# Patient Record
Sex: Female | Born: 1980 | Race: Black or African American | Hispanic: No | Marital: Single | Smoking: Never smoker
Health system: Southern US, Community
[De-identification: ages and names within clinical notes are randomized; demographics above are authoritative.]

## PROBLEM LIST (undated history)

## (undated) DIAGNOSIS — I10 Essential (primary) hypertension: Secondary | ICD-10-CM

## (undated) DIAGNOSIS — K219 Gastro-esophageal reflux disease without esophagitis: Secondary | ICD-10-CM

## (undated) DIAGNOSIS — Z9289 Personal history of other medical treatment: Secondary | ICD-10-CM

## (undated) DIAGNOSIS — D649 Anemia, unspecified: Secondary | ICD-10-CM

## (undated) HISTORY — DX: Essential (primary) hypertension: I10

---

## 2005-10-10 ENCOUNTER — Emergency Department (HOSPITAL_COMMUNITY): Admission: EM | Admit: 2005-10-10 | Discharge: 2005-10-11 | Payer: Self-pay | Admitting: Emergency Medicine

## 2006-08-29 ENCOUNTER — Emergency Department (HOSPITAL_COMMUNITY): Admission: EM | Admit: 2006-08-29 | Discharge: 2006-08-29 | Payer: Self-pay | Admitting: Emergency Medicine

## 2006-12-23 ENCOUNTER — Emergency Department (HOSPITAL_COMMUNITY): Admission: EM | Admit: 2006-12-23 | Discharge: 2006-12-23 | Payer: Self-pay | Admitting: Emergency Medicine

## 2007-02-09 ENCOUNTER — Other Ambulatory Visit: Admission: RE | Admit: 2007-02-09 | Discharge: 2007-02-09 | Payer: Self-pay | Admitting: Obstetrics and Gynecology

## 2007-03-18 ENCOUNTER — Emergency Department (HOSPITAL_COMMUNITY): Admission: EM | Admit: 2007-03-18 | Discharge: 2007-03-18 | Payer: Self-pay | Admitting: Emergency Medicine

## 2007-06-23 ENCOUNTER — Emergency Department (HOSPITAL_COMMUNITY): Admission: EM | Admit: 2007-06-23 | Discharge: 2007-06-23 | Payer: Self-pay | Admitting: Emergency Medicine

## 2007-07-08 ENCOUNTER — Emergency Department (HOSPITAL_COMMUNITY): Admission: EM | Admit: 2007-07-08 | Discharge: 2007-07-08 | Payer: Self-pay | Admitting: Emergency Medicine

## 2007-08-19 ENCOUNTER — Emergency Department (HOSPITAL_COMMUNITY): Admission: EM | Admit: 2007-08-19 | Discharge: 2007-08-19 | Payer: Self-pay | Admitting: Emergency Medicine

## 2007-08-26 ENCOUNTER — Emergency Department (HOSPITAL_COMMUNITY): Admission: EM | Admit: 2007-08-26 | Discharge: 2007-08-26 | Payer: Self-pay | Admitting: Emergency Medicine

## 2007-08-29 ENCOUNTER — Encounter: Admission: RE | Admit: 2007-08-29 | Discharge: 2007-08-29 | Payer: Self-pay | Admitting: Obstetrics and Gynecology

## 2007-08-30 ENCOUNTER — Encounter: Admission: RE | Admit: 2007-08-30 | Discharge: 2007-08-30 | Payer: Self-pay | Admitting: Family Medicine

## 2007-10-13 ENCOUNTER — Emergency Department (HOSPITAL_COMMUNITY): Admission: EM | Admit: 2007-10-13 | Discharge: 2007-10-13 | Payer: Self-pay | Admitting: Emergency Medicine

## 2007-11-15 ENCOUNTER — Emergency Department (HOSPITAL_COMMUNITY): Admission: EM | Admit: 2007-11-15 | Discharge: 2007-11-15 | Payer: Self-pay | Admitting: Emergency Medicine

## 2008-04-05 ENCOUNTER — Emergency Department (HOSPITAL_COMMUNITY): Admission: EM | Admit: 2008-04-05 | Discharge: 2008-04-05 | Payer: Self-pay | Admitting: Emergency Medicine

## 2008-06-14 ENCOUNTER — Emergency Department (HOSPITAL_COMMUNITY): Admission: EM | Admit: 2008-06-14 | Discharge: 2008-06-14 | Payer: Self-pay | Admitting: Family Medicine

## 2008-10-07 ENCOUNTER — Emergency Department (HOSPITAL_COMMUNITY): Admission: EM | Admit: 2008-10-07 | Discharge: 2008-10-07 | Payer: Self-pay | Admitting: Emergency Medicine

## 2009-09-30 ENCOUNTER — Ambulatory Visit: Payer: Self-pay | Admitting: Diagnostic Radiology

## 2009-09-30 ENCOUNTER — Emergency Department (HOSPITAL_BASED_OUTPATIENT_CLINIC_OR_DEPARTMENT_OTHER): Admission: EM | Admit: 2009-09-30 | Discharge: 2009-09-30 | Payer: Self-pay | Admitting: Emergency Medicine

## 2010-02-03 ENCOUNTER — Emergency Department (HOSPITAL_BASED_OUTPATIENT_CLINIC_OR_DEPARTMENT_OTHER): Admission: EM | Admit: 2010-02-03 | Discharge: 2010-02-03 | Payer: Self-pay | Admitting: Emergency Medicine

## 2010-04-07 ENCOUNTER — Emergency Department (HOSPITAL_BASED_OUTPATIENT_CLINIC_OR_DEPARTMENT_OTHER)
Admission: EM | Admit: 2010-04-07 | Discharge: 2010-04-07 | Payer: Self-pay | Source: Home / Self Care | Admitting: Emergency Medicine

## 2010-05-24 ENCOUNTER — Encounter: Payer: Self-pay | Admitting: Obstetrics and Gynecology

## 2010-06-05 ENCOUNTER — Emergency Department (HOSPITAL_BASED_OUTPATIENT_CLINIC_OR_DEPARTMENT_OTHER)
Admission: EM | Admit: 2010-06-05 | Discharge: 2010-06-05 | Disposition: A | Discharge status: Home / Self Care | Payer: Self-pay | Attending: Emergency Medicine | Admitting: Emergency Medicine

## 2010-06-05 DIAGNOSIS — B9789 Other viral agents as the cause of diseases classified elsewhere: Secondary | ICD-10-CM | POA: Insufficient documentation

## 2010-06-05 DIAGNOSIS — R509 Fever, unspecified: Secondary | ICD-10-CM | POA: Insufficient documentation

## 2010-07-14 LAB — POCT CARDIAC MARKERS
CKMB, poc: <1 ng/mL — ABNORMAL LOW (ref 1.0–8.0)
Myoglobin, poc: 45.1 ng/mL (ref 12–200)
Troponin i, poc: <0.05 ng/mL (ref 0.00–0.09)

## 2010-08-10 LAB — POCT I-STAT, CHEM 8
BUN: 9 mg/dL (ref 6–23)
Calcium, Ion: 1.05 mmol/L — ABNORMAL LOW (ref 1.12–1.32)
Chloride: 105 mEq/L (ref 96–112)
Creatinine, Ser: <0.2 mg/dL — ABNORMAL LOW (ref 0.4–1.2)

## 2010-08-10 LAB — D-DIMER, QUANTITATIVE: D-Dimer, Quant: 0.41 ug/mL-FEU (ref 0.00–0.48)

## 2010-08-14 ENCOUNTER — Emergency Department (HOSPITAL_BASED_OUTPATIENT_CLINIC_OR_DEPARTMENT_OTHER)
Admission: EM | Admit: 2010-08-14 | Discharge: 2010-08-14 | Disposition: A | Discharge status: Home / Self Care | Payer: Self-pay | Attending: Emergency Medicine | Admitting: Emergency Medicine

## 2010-08-14 DIAGNOSIS — I10 Essential (primary) hypertension: Secondary | ICD-10-CM | POA: Insufficient documentation

## 2010-08-14 DIAGNOSIS — J3489 Other specified disorders of nose and nasal sinuses: Secondary | ICD-10-CM | POA: Insufficient documentation

## 2010-08-14 DIAGNOSIS — J329 Chronic sinusitis, unspecified: Secondary | ICD-10-CM | POA: Insufficient documentation

## 2010-10-14 ENCOUNTER — Emergency Department (HOSPITAL_BASED_OUTPATIENT_CLINIC_OR_DEPARTMENT_OTHER)
Admission: EM | Admit: 2010-10-14 | Discharge: 2010-10-14 | Disposition: A | Discharge status: Home / Self Care | Payer: Self-pay | Attending: Emergency Medicine | Admitting: Emergency Medicine

## 2010-10-14 DIAGNOSIS — IMO0002 Reserved for concepts with insufficient information to code with codable children: Secondary | ICD-10-CM | POA: Insufficient documentation

## 2010-10-14 DIAGNOSIS — K219 Gastro-esophageal reflux disease without esophagitis: Secondary | ICD-10-CM | POA: Insufficient documentation

## 2011-01-26 LAB — DIFFERENTIAL
Basophils Absolute: 0
Eosinophils Absolute: 0.1
Eosinophils Relative: 3

## 2011-01-26 LAB — CBC
HCT: 35.4 — ABNORMAL LOW
MCV: 81.1
Platelets: 331
RDW: 15.5

## 2011-01-26 LAB — URINALYSIS, ROUTINE W REFLEX MICROSCOPIC
Glucose, UA: NEGATIVE
Ketones, ur: NEGATIVE
Leukocytes, UA: NEGATIVE
pH: 6.5

## 2011-01-26 LAB — URINE MICROSCOPIC-ADD ON

## 2011-01-26 LAB — GC/CHLAMYDIA PROBE AMP, GENITAL: GC Probe Amp, Genital: NEGATIVE

## 2011-01-28 LAB — CBC
HCT: 32.8 — ABNORMAL LOW
Hemoglobin: 10.8 — ABNORMAL LOW
MCHC: 33
MCV: 79.8
Platelets: 277
RBC: 4.11
RDW: 15.4
WBC: 4.4

## 2011-01-28 LAB — DIFFERENTIAL
Basophils Relative: 1
Lymphs Abs: 1.8
Monocytes Relative: 12
Neutro Abs: 1.9
Neutrophils Relative %: 43

## 2011-01-28 LAB — URINE MICROSCOPIC-ADD ON

## 2011-01-28 LAB — BASIC METABOLIC PANEL WITH GFR
BUN: 10
CO2: 25
Calcium: 8.8
Chloride: 106
Creatinine, Ser: 0.64
GFR calc non Af Amer: >60
Glucose, Bld: 92
Potassium: 3.6
Sodium: 139

## 2011-01-28 LAB — URINALYSIS, ROUTINE W REFLEX MICROSCOPIC
Glucose, UA: NEGATIVE
Hgb urine dipstick: NEGATIVE
Ketones, ur: NEGATIVE
Protein, ur: 30 — AB

## 2011-01-28 LAB — POCT PREGNANCY, URINE
Operator id: 29011
Preg Test, Ur: NEGATIVE

## 2011-01-29 LAB — URINALYSIS, ROUTINE W REFLEX MICROSCOPIC
Glucose, UA: NEGATIVE
Hgb urine dipstick: NEGATIVE
Protein, ur: NEGATIVE
pH: 6.5

## 2011-01-29 LAB — WET PREP, GENITAL
WBC, Wet Prep HPF POC: NEGATIVE — AB
Yeast Wet Prep HPF POC: NEGATIVE — AB

## 2011-01-29 LAB — POCT PREGNANCY, URINE
Operator id: 24446
Preg Test, Ur: NEGATIVE

## 2011-04-05 ENCOUNTER — Emergency Department (HOSPITAL_BASED_OUTPATIENT_CLINIC_OR_DEPARTMENT_OTHER)
Admission: EM | Admit: 2011-04-05 | Discharge: 2011-04-05 | Discharge status: Against Medical Advice | Payer: Self-pay | Attending: Emergency Medicine | Admitting: Emergency Medicine

## 2011-04-05 DIAGNOSIS — M25519 Pain in unspecified shoulder: Secondary | ICD-10-CM | POA: Insufficient documentation

## 2011-04-05 NOTE — ED Notes (Signed)
C/o pain to right shoulder since sat-has been moving since fri-pain is with movment

## 2011-11-22 ENCOUNTER — Emergency Department (HOSPITAL_BASED_OUTPATIENT_CLINIC_OR_DEPARTMENT_OTHER): Payer: Self-pay

## 2011-11-22 ENCOUNTER — Encounter (HOSPITAL_BASED_OUTPATIENT_CLINIC_OR_DEPARTMENT_OTHER): Payer: Self-pay

## 2011-11-22 ENCOUNTER — Emergency Department (HOSPITAL_BASED_OUTPATIENT_CLINIC_OR_DEPARTMENT_OTHER)
Admission: EM | Admit: 2011-11-22 | Discharge: 2011-11-22 | Disposition: A | Discharge status: Home / Self Care | Payer: Self-pay | Attending: Emergency Medicine | Admitting: Emergency Medicine

## 2011-11-22 DIAGNOSIS — M25569 Pain in unspecified knee: Secondary | ICD-10-CM | POA: Insufficient documentation

## 2011-11-22 MED ORDER — IBUPROFEN 800 MG PO TABS: 800.0000 mg | ORAL_TABLET | Freq: Three times a day (TID) | ORAL | Status: AC | PRN

## 2011-11-22 MED ORDER — HYDROCODONE-ACETAMINOPHEN 5-325 MG PO TABS
1.0000 | ORAL_TABLET | Freq: Once | ORAL | Status: AC
  Administered 2011-11-22: 1 via ORAL
  Filled 2011-11-22: qty 1

## 2011-11-22 MED ORDER — HYDROCODONE-ACETAMINOPHEN 5-325 MG PO TABS: ORAL_TABLET | ORAL | Status: AC

## 2011-11-22 NOTE — ED Provider Notes (Signed)
History     CSN: [id number]  Arrival date & time 11/22/11  1223   First MD Initiated Contact with Patient 11/22/11 1254      Chief Complaint  Patient presents with  . Knee Pain    (Consider location/radiation/quality/duration/timing/severity/associated sxs/prior treatment) HPI Comments: Patient presents with inner right knee pain that started yesterday after she tried to stand up. Patient felt a 'pop' and was immediately unable to bear weight on her knee. Patient has a history of knee injury in the 1990's that healed without injury. Patient denies numbness or tingling in her right foot. She has used ibuprofen for pain without relief. She has needed to hop to get around. No lower back pain. Nothing makes symptoms better other than rest. Onset was acute. Course is constant.  Patient is a 31 y.o. female presenting with knee pain. The history is provided by the patient.  Knee Pain This is a new problem. The current episode started yesterday. The problem occurs constantly. The problem has been unchanged. Associated symptoms include arthralgias. Pertinent negatives include no abdominal pain, fever, joint swelling, nausea, neck pain, numbness, vomiting or weakness. The symptoms are aggravated by bending and walking. She has tried NSAIDs and rest for the symptoms. The treatment provided no relief.    History reviewed. No pertinent past medical history.  Past Surgical History  Procedure Date  . Cesarean section     No family history on file.  History  Substance Use Topics  . Smoking status: Never Smoker   . Smokeless tobacco: Not on file  . Alcohol Use: No    OB History    Grav Para Term Preterm Abortions TAB SAB Ect Mult Living                  Review of Systems  Constitutional: Positive for activity change. Negative for fever.  HENT: Negative for neck pain.   Gastrointestinal: Negative for nausea, vomiting and abdominal pain.  Musculoskeletal: Positive for arthralgias and  gait problem. Negative for back pain and joint swelling.  Skin: Negative for wound.  Neurological: Negative for weakness and numbness.    Allergies  Review of patient's allergies indicates no known allergies.  Home Medications   Current Outpatient Rx  Name Route Sig Dispense Refill  . IBUPROFEN 200 MG PO TABS Oral Take 200 mg by mouth every 6 (six) hours as needed.        BP 142/86  Pulse 86  Temp 98 F (36.7 C) (Oral)  Resp 16  Ht 5\' 8"  (1.727 m)  Wt 290 lb (131.543 kg)  BMI 44.09 kg/m2  SpO2 100%  LMP 11/14/2011  Physical Exam  Nursing note and vitals reviewed. Constitutional: She is oriented to person, place, and time. She appears well-developed and well-nourished.  HENT:  Head: Normocephalic and atraumatic.  Eyes: Pupils are equal, round, and reactive to light.  Neck: Normal range of motion. Neck supple.  Cardiovascular: Exam reveals no decreased pulses.   Pulses:      Dorsalis pedis pulses are 2+ on the right side, and 2+ on the left side.       Posterior tibial pulses are 2+ on the right side, and 2+ on the left side.  Musculoskeletal: She exhibits tenderness. She exhibits no edema.       Right hip: Normal. She exhibits no tenderness.       Right knee: She exhibits decreased range of motion (due to pain with bending). She exhibits no swelling, no effusion, no  deformity, no erythema and no bony tenderness. tenderness found. Medial joint line tenderness noted. No lateral joint line and no patellar tendon tenderness noted.       Right ankle: Normal. no tenderness.       Patient able to extend R knee against gravity. Compartments soft.   Neurological: She is alert and oriented to person, place, and time. No sensory deficit.       Motor, sensation, and vascular distal to the injury is fully intact.   Skin: Skin is warm and dry.  Psychiatric: She has a normal mood and affect.    ED Course  Procedures (including critical care time)  Labs Reviewed - No data to  display Dg Knee Complete 4 Views Right  11/22/2011  *RADIOLOGY REPORT*  Clinical Data: Medial knee pain, heard a pop while walking last night  RIGHT KNEE - COMPLETE 4+ VIEW  Comparison: 04/05/2008  Findings: Joint spaces preserved. Osseous mineralization grossly normal. 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 No acute fracture, dislocation, or bone destruction. No knee joint effusion.  IMPRESSION: No acute abnormalities.  Original Report Authenticated By: Lollie Marrow, M.D.     1. Knee pain     1:18 PM Patient seen and examined. X-ray reviewed. Medications ordered.  Crutches offered and refused. Patient wants knee sleeve.   Vital signs reviewed and are as follows: Filed Vitals:   11/22/11 1241  BP: 142/86  Pulse: 86  Temp: 98 F (36.7 C)  Resp: 16   1:18 PM Patient counseled on use of narcotic pain medications. Counseled not to combine these medications with others containing tylenol. Urged not to drink alcohol, drive, or perform any other activities that requires focus while taking these medications. The patient verbalizes understanding and agrees with the plan.  Urged ortho follow-up in one week as needed. Counseled on RICE protocol.   Urged return if worsening. Patient verbalizes understanding and agrees with plan.    MDM  Patient with knee pain after injury. No vascular or neuro injury is suspected. Conservative therapy and rice protocol indicated with orthopedic followup as needed. Patient refuses crutches. Do not suspect septic joint or DVT. Patient is overweight and this likely has made symptoms worse. Do not suspect compartment syndrome or patellar tendon rupture, popliteal injury.        [place], [place] 11/22/11 1326

## 2011-11-22 NOTE — ED Notes (Signed)
Right knee pain that started last night.

## 2011-11-22 NOTE — ED Provider Notes (Signed)
Medical screening examination/treatment/procedure(s) were performed by non-physician practitioner and as supervising physician I was immediately available for consultation/collaboration.  Debanhi Blaker, MD 11/22/11 2353 

## 2012-03-01 ENCOUNTER — Encounter (HOSPITAL_BASED_OUTPATIENT_CLINIC_OR_DEPARTMENT_OTHER): Payer: Self-pay | Admitting: *Deleted

## 2012-03-01 ENCOUNTER — Emergency Department (HOSPITAL_BASED_OUTPATIENT_CLINIC_OR_DEPARTMENT_OTHER): Payer: Self-pay

## 2012-03-01 ENCOUNTER — Emergency Department (HOSPITAL_BASED_OUTPATIENT_CLINIC_OR_DEPARTMENT_OTHER)
Admission: EM | Admit: 2012-03-01 | Discharge: 2012-03-02 | Disposition: A | Discharge status: Home / Self Care | Payer: Self-pay | Attending: Emergency Medicine | Admitting: Emergency Medicine

## 2012-03-01 DIAGNOSIS — R110 Nausea: Secondary | ICD-10-CM | POA: Insufficient documentation

## 2012-03-01 DIAGNOSIS — D649 Anemia, unspecified: Secondary | ICD-10-CM | POA: Insufficient documentation

## 2012-03-01 DIAGNOSIS — M549 Dorsalgia, unspecified: Secondary | ICD-10-CM | POA: Insufficient documentation

## 2012-03-01 LAB — BASIC METABOLIC PANEL
BUN: 9 mg/dL (ref 6–23)
CO2: 25 mEq/L (ref 19–32)
Calcium: 8.6 mg/dL (ref 8.4–10.5)
Glucose, Bld: 93 mg/dL (ref 70–99)
Potassium: 3.3 mEq/L — ABNORMAL LOW (ref 3.5–5.1)
Sodium: 136 mEq/L (ref 135–145)

## 2012-03-01 LAB — URINALYSIS, ROUTINE W REFLEX MICROSCOPIC
Glucose, UA: NEGATIVE mg/dL
Ketones, ur: NEGATIVE mg/dL
Leukocytes, UA: NEGATIVE
Nitrite: NEGATIVE
Specific Gravity, Urine: 1.019 (ref 1.005–1.030)
pH: 7 (ref 5.0–8.0)

## 2012-03-01 LAB — CBC WITH DIFFERENTIAL/PLATELET
Basophils Absolute: 0.1 10*3/uL (ref 0.0–0.1)
Basophils Relative: 1 % (ref 0–1)
Eosinophils Absolute: 0.1 10*3/uL (ref 0.0–0.7)
HCT: 22.8 % — ABNORMAL LOW (ref 36.0–46.0)
Hemoglobin: 6.7 g/dL — CL (ref 12.0–15.0)
Lymphocytes Relative: 42 % (ref 12–46)
Lymphs Abs: 2.2 10*3/uL (ref 0.7–4.0)
MCH: 18.4 pg — ABNORMAL LOW (ref 26.0–34.0)
MCHC: 29.4 g/dL — ABNORMAL LOW (ref 30.0–36.0)
MCV: 62.6 fL — ABNORMAL LOW (ref 78.0–100.0)
Neutro Abs: 2.3 10*3/uL (ref 1.7–7.7)
RDW: 18.2 % — ABNORMAL HIGH (ref 11.5–15.5)

## 2012-03-01 LAB — WET PREP, GENITAL
WBC, Wet Prep HPF POC: NONE SEEN
Yeast Wet Prep HPF POC: NONE SEEN

## 2012-03-01 LAB — PREGNANCY, URINE: Preg Test, Ur: NEGATIVE

## 2012-03-01 MED ORDER — ONDANSETRON 8 MG PO TBDP
8.0000 mg | ORAL_TABLET | Freq: Once | ORAL | Status: AC
  Administered 2012-03-01: 8 mg via ORAL
  Filled 2012-03-01: qty 1

## 2012-03-01 MED ORDER — MORPHINE SULFATE 4 MG/ML IJ SOLN
4.0000 mg | Freq: Once | INTRAMUSCULAR | Status: AC
  Administered 2012-03-01: 4 mg via INTRAVENOUS
  Filled 2012-03-01: qty 1

## 2012-03-01 MED ORDER — IOHEXOL 300 MG/ML  SOLN
100.0000 mL | Freq: Once | INTRAMUSCULAR | Status: AC | PRN
  Administered 2012-03-02: 100 mL via INTRAVENOUS

## 2012-03-01 MED ORDER — OXYCODONE-ACETAMINOPHEN 5-325 MG PO TABS
2.0000 | ORAL_TABLET | Freq: Once | ORAL | Status: AC
  Administered 2012-03-01: 2 via ORAL
  Filled 2012-03-01 (×2): qty 2

## 2012-03-01 MED ORDER — IOHEXOL 300 MG/ML  SOLN
50.0000 mL | Freq: Once | INTRAMUSCULAR | Status: AC | PRN
  Administered 2012-03-02: 50 mL via ORAL

## 2012-03-01 MED ORDER — SODIUM CHLORIDE 0.9 % IV SOLN
Freq: Once | INTRAVENOUS | Status: AC
  Administered 2012-03-01: 23:00:00 via INTRAVENOUS

## 2012-03-01 MED ORDER — ONDANSETRON HCL 4 MG/2ML IJ SOLN
4.0000 mg | Freq: Once | INTRAMUSCULAR | Status: AC
  Administered 2012-03-01: 4 mg via INTRAVENOUS
  Filled 2012-03-01: qty 2

## 2012-03-01 NOTE — ED Notes (Signed)
Pt c/o right lower abd pain x 1 hr 

## 2012-03-01 NOTE — ED Notes (Signed)
HGB of 6.7 reported to Dr Bebe Shaggy.

## 2012-03-01 NOTE — ED Provider Notes (Signed)
History     CSN: [id number]  Arrival date & time 03/01/12  1947   First MD Initiated Contact with Patient 03/01/12 2127      Chief Complaint  Patient presents with  . Abdominal Pain     Patient is a 31 y.o. female presenting with abdominal pain. The history is provided by the patient.  Abdominal Pain The primary symptoms of the illness include abdominal pain and nausea. The primary symptoms of the illness do not include fever, vomiting, diarrhea, dysuria, vaginal discharge or vaginal bleeding. Episode onset: just prior to arrival. The onset of the illness was sudden. The problem has been gradually worsening.  Additional symptoms associated with the illness include back pain. Symptoms associated with the illness do not include urgency or frequency.  pt reports onset of RLQ pain just prior to arrival She reports some associated back pain She was working at the time, no exertion noted No vag bleeding/discharge No fever.  No dysuria She has never had this before   History reviewed. No pertinent past medical history.  Past Surgical History  Procedure Date  . Cesarean section     History reviewed. No pertinent family history.  History  Substance Use Topics  . Smoking status: Never Smoker   . Smokeless tobacco: Not on file  . Alcohol Use: No    OB History    Grav Para Term Preterm Abortions TAB SAB Ect Mult Living                  Review of Systems  Constitutional: Negative for fever.  Gastrointestinal: Positive for nausea and abdominal pain. Negative for vomiting and diarrhea.  Genitourinary: Negative for dysuria, urgency, frequency, vaginal bleeding and vaginal discharge.  Musculoskeletal: Positive for back pain.  Neurological: Negative for weakness.  All other systems reviewed and are negative.    Allergies  Review of patient's allergies indicates no known allergies.  Home Medications   Current Outpatient Rx  Name Route Sig Dispense Refill  . IBUPROFEN  200 MG PO TABS Oral Take 200 mg by mouth every 6 (six) hours as needed.        Pulse 91  Temp 98.5 F (36.9 C) (Oral)  Resp 16  Ht 5\' 9"  (1.753 m)  Wt 290 lb (131.543 kg)  BMI 42.83 kg/m2  SpO2 99%  LMP 02/29/2012  Physical Exam CONSTITUTIONAL: Well developed/well nourished HEAD AND FACE: Normocephalic/atraumatic EYES: EOMI/PERRL ENMT: Mucous membranes moist NECK: supple no meningeal signs SPINE:entire spine nontender CV: S1/S2 noted, no murmurs/rubs/gallops noted LUNGS: Lungs are clear to auscultation bilaterally, no apparent distress ABDOMEN: soft, RLQ tenderness noted, tenderness is moderate, no rebound or guarding GU:no cva tenderness, no cmt, small amt of vag discharge, no vag bleeding, no adnexal tenderness/mass, chaperone present NEURO: Pt is awake/alert, moves all extremitiesx4 EXTREMITIES: pulses normal, full ROM SKIN: warm, color normal PSYCH: no abnormalities of mood noted  ED Course  Procedures  Labs Reviewed  WET PREP, GENITAL - Abnormal; Notable for the following:    Clue Cells Wet Prep HPF POC FEW (*)     All other components within normal limits  URINALYSIS, ROUTINE W REFLEX MICROSCOPIC  PREGNANCY, URINE  GC/CHLAMYDIA PROBE AMP, GENITAL  CBC WITH DIFFERENTIAL  BASIC METABOLIC PANEL  11:08 PM Pt with continued RLQ pain.  Her pelvic exam was unremarkable.  Given persistent pain, will proceed with CT imaging Pt agreeable At signout, plan is to f/u on labs/imaging and reassess   MDM  Nursing notes including past  medical history and social history reviewed and considered in documentation Labs/vital reviewed and considered         Joya Gaskins, MD 03/01/12 2308

## 2012-03-02 LAB — GC/CHLAMYDIA PROBE AMP, GENITAL
Chlamydia, DNA Probe: NEGATIVE
GC Probe Amp, Genital: NEGATIVE

## 2012-03-02 MED ORDER — FERROUS SULFATE 325 (65 FE) MG PO TABS: 325.0000 mg | ORAL_TABLET | Freq: Three times a day (TID) | ORAL | Status: DC

## 2012-03-02 NOTE — ED Notes (Signed)
Patient transported to CT 

## 2012-04-13 ENCOUNTER — Encounter (HOSPITAL_BASED_OUTPATIENT_CLINIC_OR_DEPARTMENT_OTHER): Payer: Self-pay | Admitting: Emergency Medicine

## 2012-04-13 ENCOUNTER — Emergency Department (HOSPITAL_BASED_OUTPATIENT_CLINIC_OR_DEPARTMENT_OTHER)
Admission: EM | Admit: 2012-04-13 | Discharge: 2012-04-13 | Disposition: A | Discharge status: Home / Self Care | Payer: Self-pay | Attending: Emergency Medicine | Admitting: Emergency Medicine

## 2012-04-13 DIAGNOSIS — R079 Chest pain, unspecified: Secondary | ICD-10-CM | POA: Insufficient documentation

## 2012-04-13 DIAGNOSIS — K2960 Other gastritis without bleeding: Secondary | ICD-10-CM | POA: Insufficient documentation

## 2012-04-13 DIAGNOSIS — G8929 Other chronic pain: Secondary | ICD-10-CM | POA: Insufficient documentation

## 2012-04-13 DIAGNOSIS — K089 Disorder of teeth and supporting structures, unspecified: Secondary | ICD-10-CM | POA: Insufficient documentation

## 2012-04-13 MED ORDER — HYDROCODONE-ACETAMINOPHEN 5-325 MG PO TABS: 1.0000 | ORAL_TABLET | Freq: Four times a day (QID) | ORAL | Status: DC | PRN

## 2012-04-13 MED ORDER — FAMOTIDINE 20 MG PO TABS
ORAL_TABLET | ORAL | Status: AC
  Filled 2012-04-13: qty 1

## 2012-04-13 MED ORDER — OMEPRAZOLE 20 MG PO CPDR: 20.0000 mg | DELAYED_RELEASE_CAPSULE | Freq: Every day | ORAL | Status: DC

## 2012-04-13 MED ORDER — PANTOPRAZOLE SODIUM 40 MG PO TBEC: 40.0000 mg | DELAYED_RELEASE_TABLET | Freq: Every day | ORAL | Status: DC

## 2012-04-13 MED ORDER — ALUM & MAG HYDROXIDE-SIMETH 200-200-20 MG/5ML PO SUSP
30.0000 mL | Freq: Once | ORAL | Status: AC
  Administered 2012-04-13: 30 mL via ORAL
  Filled 2012-04-13: qty 30

## 2012-04-13 MED ORDER — FAMOTIDINE 20 MG PO TABS
20.0000 mg | ORAL_TABLET | Freq: Once | ORAL | Status: AC
  Administered 2012-04-13: 20 mg via ORAL

## 2012-04-13 NOTE — ED Provider Notes (Signed)
History     CSN: [id number]  Arrival date & time 04/13/12  2127   First MD Initiated Contact with Patient 04/13/12 2245      Chief Complaint  Patient presents with  . Dental Pain  . Chest Pain    (Consider location/radiation/quality/duration/timing/severity/associated sxs/prior treatment) HPITameka Perez is a 31 y.o. female presenting with chest pain that is mid to low sternal even subxiphoid it started while resting, she was at the computer, she says the chest pain can be worse with movement and deep breathing. The pain is not associated with nausea vomiting, diaphoresis, or radiation.  Pain is described as sharp, and moderate to severe.  Patient is also complaining about pain which is sharp, lancinating at times into the right side of her face, this comes from a carious tooth which she does have a followup appointment to see a dentist next week. Pain is been severe, she has been taking ibuprofen frequently for her pain. Patient denies any history of blood clots in her legs or lungs, any recent injuries, any recent long periods of travel, no history of hemoptysis, she does not take exogenous estrogen.  Patient does not smoke, is not diabetic, does not have high blood pressure, is not being treated for hyperlipidemia. History reviewed. No pertinent past medical history.  Past Surgical History  Procedure Date  . Cesarean section     No family history on file.  History  Substance Use Topics  . Smoking status: Never Smoker   . Smokeless tobacco: Not on file  . Alcohol Use: No    OB History    Grav Para Term Preterm Abortions TAB SAB Ect Mult Living                  Review of Systems At least 10pt or greater review of systems completed and are negative except where specified in the HPI.  Allergies  Review of patient's allergies indicates no known allergies.  Home Medications   Current Outpatient Rx  Name  Route  Sig  Dispense  Refill  . FERROUS SULFATE 325 (65 FE)  MG PO TABS   Oral   Take 1 tablet (325 mg total) by mouth 3 (three) times daily with meals.   90 tablet   0   . IBUPROFEN 200 MG PO TABS   Oral   Take 400 mg by mouth once.          Marland Kitchen HYDROCODONE-ACETAMINOPHEN 5-325 MG PO TABS   Oral   Take 1-2 tablets by mouth every 6 (six) hours as needed for pain.   29 tablet   0   . OMEPRAZOLE 20 MG PO CPDR   Oral   Take 1 capsule (20 mg total) by mouth daily. NSAID induced gastritis   30 capsule   0     BP 176/94  Pulse 87  Temp 98.3 F (36.8 C) (Oral)  Resp 18  Ht 5\' 8"  (1.727 m)  Wt 295 lb (133.811 kg)  BMI 44.85 kg/m2  SpO2 100%  Physical Exam  Nursing notes reviewed.  Electronic medical record reviewed. VITAL SIGNS:   Filed Vitals:   04/13/12 2132  BP: 176/94  Pulse: 87  Temp: 98.3 F (36.8 C)  TempSrc: Oral  Resp: 18  Height: 5\' 8"  (1.727 m)  Weight: 295 lb (133.811 kg)  SpO2: 100%   CONSTITUTIONAL: Awake, oriented, appears non-toxic HENT: Atraumatic, normocephalic, oral mucosa pink and moist, airway patent. Nares patent without drainage. External ears normal. EYES: Conjunctiva  clear, EOMI, PERRLA NECK: Trachea midline, non-tender, supple CARDIOVASCULAR: Normal heart rate, Normal rhythm, No murmurs, rubs, gallops PULMONARY/CHEST: Clear to auscultation, no rhonchi, wheezes, or rales. Symmetrical breath sounds. Reproducible chest pain in the lower sternum. ABDOMINAL: Non-distended, soft, non-tender - no rebound or guarding.  BS normal. NEUROLOGIC: Non-focal, moving all four extremities, no gross sensory or motor deficits. EXTREMITIES: No clubbing, cyanosis, or edema SKIN: Warm, Dry, No erythema, No rash  ED Course  Procedures (including critical care time)  Date: 04/14/2012  Rate: 77  Rhythm: normal sinus rhythm  QRS Axis: normal  Intervals: normal  ST/T Wave abnormalities: normal  Conduction Disutrbances: none  Narrative Interpretation: unremarkable - nonischemic EKG with, no significant changes when  compared with prior EKG dated 04/07/2000   Labs Reviewed - No data to display No results found.   1. Chronic dental pain   2. NSAID induced gastritis       MDM  Gabriela Perez is a 31 y.o. female presenting with a sharp intermittent chest pain, patient has been taking frequent ibuprofen for her dental pain, she says that it's not really helping - EKG is completely normal. Patient is mildly hypertensive emergency department but has not been treated for hypertension the past. Patient has no family history of early coronary disease or MI or sudden cardiac death either, but she is low risk for CAD or ACS at this time. She is PERC negative. Breath sounds are clear bilaterally, do not think she's got any emergent intrathoracic condition at this time. Do not think further testing is indicated at this time. Will treat the patient with Prilosec for presumed NSAID induced gastritis. Give the patient some pain medicine for her tooth-she does have followup with a dentist next week.  I explained the diagnosis and have given explicit precautions to return to the ER including any other new or worsening symptoms. The patient understands and accepts the medical plan as it's been dictated and I have answered their questions. Discharge instructions concerning home care and prescriptions have been given.  The patient is STABLE and is discharged to home in good condition.         Jones Skene, MD 04/14/12 0005

## 2012-04-13 NOTE — ED Notes (Signed)
Pt c/o tooth pain and chest pain. Pt states chest pain is sharp, midsternal, started while sitting at computer. Pt states chest pain is worse with movement and deep breath. Pt denies any shob, n/v/d.

## 2012-08-01 DIAGNOSIS — Z9289 Personal history of other medical treatment: Secondary | ICD-10-CM

## 2012-08-01 HISTORY — DX: Personal history of other medical treatment: Z92.89

## 2012-08-09 ENCOUNTER — Inpatient Hospital Stay (HOSPITAL_COMMUNITY): Payer: Medicaid Other

## 2012-08-09 ENCOUNTER — Inpatient Hospital Stay (HOSPITAL_COMMUNITY)
Admission: AD | Admit: 2012-08-09 | Discharge: 2012-08-10 | Disposition: A | Discharge status: Home / Self Care | Payer: Medicaid Other | Source: Ambulatory Visit | Attending: Obstetrics & Gynecology | Admitting: Obstetrics & Gynecology

## 2012-08-09 ENCOUNTER — Encounter (HOSPITAL_COMMUNITY): Payer: Self-pay | Admitting: *Deleted

## 2012-08-09 DIAGNOSIS — N83299 Other ovarian cyst, unspecified side: Secondary | ICD-10-CM

## 2012-08-09 DIAGNOSIS — D500 Iron deficiency anemia secondary to blood loss (chronic): Secondary | ICD-10-CM

## 2012-08-09 DIAGNOSIS — N83209 Unspecified ovarian cyst, unspecified side: Secondary | ICD-10-CM | POA: Insufficient documentation

## 2012-08-09 DIAGNOSIS — R109 Unspecified abdominal pain: Secondary | ICD-10-CM | POA: Insufficient documentation

## 2012-08-09 DIAGNOSIS — N920 Excessive and frequent menstruation with regular cycle: Secondary | ICD-10-CM | POA: Insufficient documentation

## 2012-08-09 DIAGNOSIS — L293 Anogenital pruritus, unspecified: Secondary | ICD-10-CM | POA: Insufficient documentation

## 2012-08-09 DIAGNOSIS — I10 Essential (primary) hypertension: Secondary | ICD-10-CM | POA: Insufficient documentation

## 2012-08-09 DIAGNOSIS — N940 Mittelschmerz: Secondary | ICD-10-CM | POA: Insufficient documentation

## 2012-08-09 HISTORY — DX: Anemia, unspecified: D64.9

## 2012-08-09 LAB — CBC
HCT: 23.6 % — ABNORMAL LOW (ref 36.0–46.0)
Hemoglobin: 6.6 g/dL — CL (ref 12.0–15.0)
MCV: 61.5 fL — ABNORMAL LOW (ref 78.0–100.0)
RBC: 3.84 MIL/uL — ABNORMAL LOW (ref 3.87–5.11)
RDW: 18.8 % — ABNORMAL HIGH (ref 11.5–15.5)
WBC: 5.5 10*3/uL (ref 4.0–10.5)

## 2012-08-09 LAB — POCT PREGNANCY, URINE: Preg Test, Ur: NEGATIVE

## 2012-08-09 LAB — URINALYSIS, ROUTINE W REFLEX MICROSCOPIC
Glucose, UA: NEGATIVE mg/dL
Leukocytes, UA: NEGATIVE
Protein, ur: NEGATIVE mg/dL
pH: 6 (ref 5.0–8.0)

## 2012-08-09 LAB — WET PREP, GENITAL: Clue Cells Wet Prep HPF POC: NONE SEEN

## 2012-08-09 MED ORDER — OXYCODONE-ACETAMINOPHEN 5-325 MG PO TABS
ORAL_TABLET | ORAL | Status: AC
  Filled 2012-08-09: qty 2

## 2012-08-09 MED ORDER — OXYCODONE-ACETAMINOPHEN 5-325 MG PO TABS: 2.0000 | ORAL_TABLET | Freq: Once | ORAL | Status: DC

## 2012-08-09 MED ORDER — KETOROLAC TROMETHAMINE 60 MG/2ML IM SOLN
60.0000 mg | Freq: Once | INTRAMUSCULAR | Status: AC
  Administered 2012-08-09: 60 mg via INTRAMUSCULAR
  Filled 2012-08-09: qty 2

## 2012-08-09 NOTE — MAU Note (Addendum)
Pt has Critical value Hgb 6.6 Gabriela Perez CNm informedCRITICAL VALUE ALERT  Critical value received:  2304  Date of notification:  08/09/12  Time of notification: 2304  Critical value read back:yes  Nurse who received alert:  B.Eduard Clos RN  MD notified (1st page):  Ivonne Andrew CNM  Time of first page:  2304  MD notified (2nd page):  Time of second page:  Responding MD:  GabrielaSmtih CNM  Time MD responded:

## 2012-08-09 NOTE — MAU Provider Note (Signed)
Chief Complaint: Abdominal Pain  First Provider Initiated Contact with Patient 08/09/12 2155      SUBJECTIVE HPI: Gabriela Perez is a 32 y.o. G1P1001 non-pregnant female who presents with sharp low mid abd pain x 6 hours and vaginal itching. No relief of pain w/ Ibuprofen. No Hx similar pain. There are no aggravating or aleviating factors. Patient's last menstrual period was 07/30/2012. Rates pain 8/10.  Past Medical History  Diagnosis Date  . Anemia    OB History   Grav Para Term Preterm Abortions TAB SAB Ect Mult Living   1 1 1  0 0 0 0 0 0 1     # Outc Date GA Lbr Len/2nd Wgt Sex Del Anes PTL Lv   1 TRM              Past Surgical History  Procedure Laterality Date  . Cesarean section     History   Social History  . Marital Status: Single    Spouse Name: N/A    Number of Children: N/A  . Years of Education: N/A   Occupational History  . Not on file.   Social History Main Topics  . Smoking status: Never Smoker   . Smokeless tobacco: Not on file  . Alcohol Use: No  . Drug Use: No  . Sexually Active: Not on file   Other Topics Concern  . Not on file   Social History Narrative  . No narrative on file   No current facility-administered medications on file prior to encounter.   No current outpatient prescriptions on file prior to encounter.   No Known Allergies  ROS: Neg for fever, chills, urinary complaints, GI complaints, vaginal discharge, vaginal bleeding, dyspareunia.   OBJECTIVE Blood pressure 159/86, pulse 83, temperature 98 F (36.7 C), temperature source Oral, resp. rate 20, height 5\' 9"  (1.753 m), weight 137.44 kg (303 lb), last menstrual period 07/30/2012, SpO2 100.00%. GENERAL: Well-developed, well-nourished female in mild distress.  HEENT: Normocephalic HEART: normal rate RESP: normal effort ABDOMEN: Soft, non-tender. Pos BS x 4 EXTREMITIES: Nontender, no edema NEURO: Alert and oriented SPECULUM EXAM: NEFG, thick, clear, mildly malodorous  discharge, no blood noted, cervix clean BIMANUAL: cervix closed; UTA uterine size due to body habitus, moderate supropubic tenderness. No CMT. No adnexal tenderness or masses  LAB RESULTS Results for orders placed during the hospital encounter of 08/09/12 (from the past 24 hour(s))  URINALYSIS, ROUTINE W REFLEX MICROSCOPIC     Status: None   Collection Time    08/09/12  6:45 PM      Result Value Range   Color, Urine YELLOW  YELLOW   APPearance CLEAR  CLEAR   Specific Gravity, Urine 1.025  1.005 - 1.030   pH 6.0  5.0 - 8.0   Glucose, UA NEGATIVE  NEGATIVE mg/dL   Hgb urine dipstick NEGATIVE  NEGATIVE   Bilirubin Urine NEGATIVE  NEGATIVE   Ketones, ur NEGATIVE  NEGATIVE mg/dL   Protein, ur NEGATIVE  NEGATIVE mg/dL   Urobilinogen, UA 0.2  0.0 - 1.0 mg/dL   Nitrite NEGATIVE  NEGATIVE   Leukocytes, UA NEGATIVE  NEGATIVE  POCT PREGNANCY, URINE     Status: None   Collection Time    08/09/12  6:52 PM      Result Value Range   Preg Test, Ur NEGATIVE  NEGATIVE  WET PREP, GENITAL     Status: None   Collection Time    08/09/12  8:50 PM      Result  Value Range   Yeast Wet Prep HPF POC NONE SEEN  NONE SEEN   Trich, Wet Prep NONE SEEN  NONE SEEN   Clue Cells Wet Prep HPF POC NONE SEEN  NONE SEEN   WBC, Wet Prep HPF POC NONE SEEN  NONE SEEN  CBC     Status: Abnormal   Collection Time    08/09/12 10:20 PM      Result Value Range   WBC 5.5  4.0 - 10.5 K/uL   RBC 3.84 (*) 3.87 - 5.11 MIL/uL   Hemoglobin 6.6 (*) 12.0 - 15.0 g/dL   HCT 29.5 (*) 62.1 - 30.8 %   MCV 61.5 (*) 78.0 - 100.0 fL   MCH 17.2 (*) 26.0 - 34.0 pg   MCHC 28.0 (*) 30.0 - 36.0 g/dL   RDW 65.7 (*) 84.6 - 96.2 %   Platelets 505 (*) 150 - 400 K/uL    IMAGING US Transvaginal Non-ob  08/10/2012  *RADIOLOGY REPORT*  Clinical Data: Pelvic pain.  TRANSABDOMINAL AND TRANSVAGINAL ULTRASOUND OF PELVIS Technique:  Both transabdominal and transvaginal ultrasound examinations of the pelvis were performed. Transabdominal technique  was performed for global imaging of the pelvis including uterus, ovaries, adnexal regions, and pelvic cul-de-sac.  It was necessary to proceed with endovaginal exam following the transabdominal exam to visualize the ovaries.  Comparison:  CT abdomen and pelvis 03/02/2012.  Pelvic ultrasound 08/26/2007.  Findings:  Uterus: The uterus is enlarged, measuring 16.1 x 5.0 x 6.6 cm.  At least two large fibroids are evident.  A fibroid in the lower uterine segment is 2.8 x 2.8 x 3.0 cm.  A more fundal fibroid measures 2.5 x 2.1 x 2.3 cm.  Endometrium: Normal in size.  The maximal thickness is 9.4 mm.  Right ovary:  The right ovary is of normal size and echotexture measuring 2.8 x 1.9 x 1.5 cm.  Left ovary: The left ovary measures 3.3 x 2.2 x 2.6 cm.  A para ovarian cyst measures 2.4 x 1.8 x 1.3 cm.  Other findings: No free fluid  IMPRESSION:  1.  Increasing size of uterine fibroids. 2.  2.4 cm left para ovarian cyst.   Original Report Authenticated By: Marin Roberts, M.D.    US Pelvis Complete  08/10/2012  *RADIOLOGY REPORT*  Clinical Data: Pelvic pain.  TRANSABDOMINAL AND TRANSVAGINAL ULTRASOUND OF PELVIS Technique:  Both transabdominal and transvaginal ultrasound examinations of the pelvis were performed. Transabdominal technique was performed for global imaging of the pelvis including uterus, ovaries, adnexal regions, and pelvic cul-de-sac.  It was necessary to proceed with endovaginal exam following the transabdominal exam to visualize the ovaries.  Comparison:  CT abdomen and pelvis 03/02/2012.  Pelvic ultrasound 08/26/2007.  Findings:  Uterus: The uterus is enlarged, measuring 16.1 x 5.0 x 6.6 cm.  At least two large fibroids are evident.  A fibroid in the lower uterine segment is 2.8 x 2.8 x 3.0 cm.  A more fundal fibroid measures 2.5 x 2.1 x 2.3 cm.  Endometrium: Normal in size.  The maximal thickness is 9.4 mm.  Right ovary:  The right ovary is of normal size and echotexture measuring 2.8 x 1.9 x 1.5 cm.   Left ovary: The left ovary measures 3.3 x 2.2 x 2.6 cm.  A para ovarian cyst measures 2.4 x 1.8 x 1.3 cm.  Other findings: No free fluid  IMPRESSION:  1.  Increasing size of uterine fibroids. 2.  2.4 cm left para ovarian cyst.   Original Report  Authenticated By: Marin Roberts, M.D.     MAU COURSE Mild relief of pain w/ 2 percocet. Toradol ordered. Waiting for [place].   Pain resolved.   ASSESSMENT 1. Physiological ovarian cysts   2. Mittelschmerz   3. 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   4. Hypertension     PLAN Discharge home. Increase dietary iron. Take FeSo4 TID as directed and stool softener PRN.     Follow-up Information   Follow up with [facility]. (Will call to schedule appointment)    Contact information:   [address] [place] [address] [phone]      Follow up with [facility]. (for primary care, hypertension)    Contact information:   [address] [place] [address] [phone]      Follow up with [facility] [place] [facility]. (As needed if symptoms worsen)    Contact information:   [address] [place] [address] [phone]       Medication List    STOP taking these medications       ibuprofen 200 MG tablet  Commonly known as:  ADVIL,MOTRIN      TAKE these medications       ferrous sulfate 325 (65 FE) MG tablet  Take 325 mg by mouth 3 (three) times daily with meals.     ketorolac 10 MG tablet  Commonly known as:  TORADOL  Take 1 tablet (10 mg total) by mouth every 6 (six) hours as needed for pain.     oxyCODONE-acetaminophen 5-325 MG per tablet  Commonly known as:  PERCOCET/ROXICET  Take 1-2 tablets by mouth every 4 (four) hours as needed for pain.       [place], CNM 08/10/2012  12:59 AM

## 2012-08-09 NOTE — MAU Note (Signed)
Pt states she has been having abdominal pain for about 6 hours ago. Pt denies vaginal bleeding and discharge

## 2012-08-09 NOTE — MAU Note (Signed)
Patient states she has had vaginal itching for about one week. Has had lower abdominal pain for about 4 hours. Denies bleeding

## 2012-08-10 DIAGNOSIS — N83209 Unspecified ovarian cyst, unspecified side: Secondary | ICD-10-CM

## 2012-08-10 MED ORDER — NYSTATIN-TRIAMCINOLONE 100000-0.1 UNIT/GM-% EX OINT: TOPICAL_OINTMENT | Freq: Two times a day (BID) | CUTANEOUS | Status: DC

## 2012-08-10 MED ORDER — KETOROLAC TROMETHAMINE 10 MG PO TABS: 10.0000 mg | ORAL_TABLET | Freq: Four times a day (QID) | ORAL | Status: DC | PRN

## 2012-08-10 MED ORDER — OXYCODONE-ACETAMINOPHEN 5-325 MG PO TABS: 1.0000 | ORAL_TABLET | ORAL | Status: DC | PRN

## 2012-08-11 ENCOUNTER — Encounter: Payer: Self-pay | Admitting: *Deleted

## 2012-08-14 ENCOUNTER — Encounter (HOSPITAL_BASED_OUTPATIENT_CLINIC_OR_DEPARTMENT_OTHER): Payer: Self-pay | Admitting: *Deleted

## 2012-08-14 ENCOUNTER — Observation Stay (HOSPITAL_BASED_OUTPATIENT_CLINIC_OR_DEPARTMENT_OTHER)
Admission: EM | Admit: 2012-08-14 | Discharge: 2012-08-15 | Disposition: A | Discharge status: Home / Self Care | Payer: Medicaid Other | Attending: Obstetrics & Gynecology | Admitting: Obstetrics & Gynecology

## 2012-08-14 DIAGNOSIS — D219 Benign neoplasm of connective and other soft tissue, unspecified: Secondary | ICD-10-CM

## 2012-08-14 DIAGNOSIS — N920 Excessive and frequent menstruation with regular cycle: Secondary | ICD-10-CM

## 2012-08-14 DIAGNOSIS — R030 Elevated blood-pressure reading, without diagnosis of hypertension: Secondary | ICD-10-CM | POA: Insufficient documentation

## 2012-08-14 DIAGNOSIS — N83209 Unspecified ovarian cyst, unspecified side: Secondary | ICD-10-CM | POA: Insufficient documentation

## 2012-08-14 DIAGNOSIS — D500 Iron deficiency anemia secondary to blood loss (chronic): Secondary | ICD-10-CM

## 2012-08-14 DIAGNOSIS — D259 Leiomyoma of uterus, unspecified: Secondary | ICD-10-CM | POA: Insufficient documentation

## 2012-08-14 DIAGNOSIS — D649 Anemia, unspecified: Secondary | ICD-10-CM

## 2012-08-14 LAB — CBC WITH DIFFERENTIAL/PLATELET
Basophils Relative: 1 % (ref 0–1)
Eosinophils Absolute: 0.1 10*3/uL (ref 0.0–0.7)
Eosinophils Relative: 2 % (ref 0–5)
HCT: 25.6 % — ABNORMAL LOW (ref 36.0–46.0)
Hemoglobin: 7.4 g/dL — ABNORMAL LOW (ref 12.0–15.0)
Lymphs Abs: 1.8 10*3/uL (ref 0.7–4.0)
MCH: 17.5 pg — ABNORMAL LOW (ref 26.0–34.0)
MCHC: 28.9 g/dL — ABNORMAL LOW (ref 30.0–36.0)
MCV: 60.7 fL — ABNORMAL LOW (ref 78.0–100.0)
Monocytes Absolute: 0.6 10*3/uL (ref 0.1–1.0)
Neutro Abs: 3 10*3/uL (ref 1.7–7.7)
Neutrophils Relative %: 55 % (ref 43–77)

## 2012-08-14 LAB — URINALYSIS, ROUTINE W REFLEX MICROSCOPIC
Bilirubin Urine: NEGATIVE
Hgb urine dipstick: NEGATIVE
Ketones, ur: NEGATIVE mg/dL
Nitrite: NEGATIVE
Urobilinogen, UA: 1 mg/dL (ref 0.0–1.0)

## 2012-08-14 LAB — BASIC METABOLIC PANEL
BUN: 6 mg/dL (ref 6–23)
Calcium: 8.9 mg/dL (ref 8.4–10.5)
Creatinine, Ser: 0.5 mg/dL (ref 0.50–1.10)
GFR calc Af Amer: >90 mL/min (ref >90)
GFR calc non Af Amer: >90 mL/min (ref >90)
Glucose, Bld: 90 mg/dL (ref 70–99)

## 2012-08-14 MED ORDER — KETOROLAC TROMETHAMINE 30 MG/ML IJ SOLN
30.0000 mg | Freq: Once | INTRAMUSCULAR | Status: AC
  Administered 2012-08-14: 30 mg via INTRAVENOUS
  Filled 2012-08-14: qty 1

## 2012-08-14 MED ORDER — SODIUM CHLORIDE 0.9 % IV BOLUS (SEPSIS)
1000.0000 mL | Freq: Once | INTRAVENOUS | Status: AC
  Administered 2012-08-14: 1000 mL via INTRAVENOUS

## 2012-08-14 MED ORDER — DIPHENHYDRAMINE HCL 50 MG/ML IJ SOLN
25.0000 mg | Freq: Once | INTRAMUSCULAR | Status: AC
  Administered 2012-08-14: 25 mg via INTRAVENOUS
  Filled 2012-08-14: qty 1

## 2012-08-14 NOTE — ED Notes (Signed)
Family at bedside. 

## 2012-08-14 NOTE — ED Notes (Signed)
No complaints of dizziness or nausea

## 2012-08-14 NOTE — ED Provider Notes (Signed)
History     CSN: [id number]  Arrival date & time 08/14/12  1904   First MD Initiated Contact with Patient 08/14/12 1915      Chief Complaint  Patient presents with  . Hypertension    (Consider location/radiation/quality/duration/timing/severity/associated sxs/prior treatment) HPI Comments: Patient is a 32 year old female with a past medical history of anemia who presents after a syncopal episode that occurred earlier today. Patient reports having abdominal pain, feeling dizzy and falling to the floor. Her friend saw her after she fell and states she was "unresponsive." Patient currently reports RLQ pain that is sharp and moderate. She reports associated headache, weakness, and chills. No aggravating/alleviating factors. LMP 07/30/2012.   Patient is a 32 y.o. female presenting with hypertension.  Hypertension Associated symptoms include abdominal pain.    Past Medical History  Diagnosis Date  . Anemia     Past Surgical History  Procedure Laterality Date  . Cesarean section      Family History  Problem Relation Age of Onset  . Hypertension Mother   . Diabetes Mother   . Hypertension Father     History  Substance Use Topics  . Smoking status: Never Smoker   . Smokeless tobacco: Not on file  . Alcohol Use: No    OB History   Grav Para Term Preterm Abortions TAB SAB Ect Mult Living   1 1 1  0 0 0 0 0 0 1      Review of Systems  Gastrointestinal: Positive for abdominal pain.  Neurological: Positive for dizziness and syncope.  All other systems reviewed and are negative.    Allergies  Review of patient's allergies indicates no known allergies.  Home Medications   Current Outpatient Rx  Name  Route  Sig  Dispense  Refill  . ferrous sulfate 325 (65 FE) MG tablet   Oral   Take 325 mg by mouth 3 (three) times daily with meals.         Marland Kitchen ketorolac (TORADOL) 10 MG tablet   Oral   Take 1 tablet (10 mg total) by mouth every 6 (six) hours as needed for pain.  20 tablet   0   . nystatin-triamcinolone ointment (MYCOLOG)   Topical   Apply topically 2 (two) times daily.   30 g   1   . oxyCODONE-acetaminophen (PERCOCET/ROXICET) 5-325 MG per tablet   Oral   Take 1-2 tablets by mouth every 4 (four) hours as needed for pain.   40 tablet   0     BP 153/89  Pulse 90  Temp(Src) 98.8 F (37.1 C) (Oral)  Resp 18  Ht 5\' 8"  (1.727 m)  Wt 303 lb (137.44 kg)  BMI 46.08 kg/m2  SpO2 100%  LMP 07/30/2012  Physical Exam  Nursing note and vitals reviewed. Constitutional: She appears well-developed and well-nourished. No distress.  HENT:  Head: Normocephalic and atraumatic.  Eyes: Conjunctivae and EOM are normal. Pupils are equal, round, and reactive to light.  Neck: Normal range of motion.  Cardiovascular: Normal rate and regular rhythm.  Exam reveals no gallop and no friction rub.   No murmur heard. Pulmonary/Chest: Effort normal and breath sounds normal. She has no wheezes. She has no rales. She exhibits no tenderness.  Abdominal: Soft. She exhibits no distension. There is tenderness. There is no rebound and no guarding.  RLQ tenderness to palpation.   Musculoskeletal: Normal range of motion.  Neurological: She is alert.  Speech is goal-oriented. Moves limbs without ataxia.  Skin: Skin is warm and dry.  Psychiatric: She has a normal mood and affect. Her behavior is normal.    ED Course  Procedures (including critical care time)  Labs Reviewed  CBC WITH DIFFERENTIAL - Abnormal; Notable for the following:    Hemoglobin 7.4 (*)    HCT 25.6 (*)    MCV 60.7 (*)    MCH 17.5 (*)    MCHC 28.9 (*)    RDW 19.3 (*)    Platelets 520 (*)    All other components within normal limits  BASIC METABOLIC PANEL - Abnormal; Notable for the following:    Potassium 3.4 (*)    All other components within normal limits  URINE CULTURE  URINALYSIS, ROUTINE W REFLEX MICROSCOPIC  PREGNANCY, URINE  TYPE AND SCREEN   No results found.   1. Anemia  requiring transfusions       MDM  9:42 PM Labs show hgb 7.4 which has been low in the past but patient is symptomatic. Vitals stable. Patient will have type and screen.   10:53 PM Patient will be admitted at [facility] I spoke with Dr. Penne Lash who will accept the patient.       Emilia Beck, PA-C 08/14/12 2306

## 2012-08-14 NOTE — ED Notes (Signed)
Pt. Is in  No distress and is talking and laughing with friend at bedside.

## 2012-08-14 NOTE — ED Notes (Signed)
Pt states that she has been experiencing dizziness for the past two days. Pt has a sensitivity to light which started about two hours ago. Pt states that she has right sided abdominal pain also that resembled the pain she experienced last week on her left side. Pt states she was diagnosed with two cysts on her left ovary. Pt states she feels weak. Pt states she is not having any issues eating or sleeping.

## 2012-08-14 NOTE — ED Notes (Signed)
Was seen by her chiropractor this am and her BP was elevated. She was advised to see her MD. She was unable to see her MD today. She got dizzy at home after abdominal pain and fell to the floor. EMS was called and did an EKG that was normal. She complains of abdominal pain on arrival to triage. Alert oriented.

## 2012-08-14 NOTE — ED Notes (Signed)
Patient is resting comfortably. 

## 2012-08-15 ENCOUNTER — Encounter (HOSPITAL_COMMUNITY): Payer: Self-pay | Admitting: *Deleted

## 2012-08-15 DIAGNOSIS — D500 Iron deficiency anemia secondary to blood loss (chronic): Secondary | ICD-10-CM

## 2012-08-15 DIAGNOSIS — N920 Excessive and frequent menstruation with regular cycle: Secondary | ICD-10-CM

## 2012-08-15 DIAGNOSIS — D649 Anemia, unspecified: Secondary | ICD-10-CM

## 2012-08-15 LAB — ABO/RH: ABO/RH(D): A POS

## 2012-08-15 LAB — CBC
Platelets: 411 10*3/uL — ABNORMAL HIGH (ref 150–400)
RBC: 4.2 MIL/uL (ref 3.87–5.11)
RDW: 21.7 % — ABNORMAL HIGH (ref 11.5–15.5)
WBC: 4.2 10*3/uL (ref 4.0–10.5)

## 2012-08-15 LAB — RETICULOCYTES
RBC.: 3.69 MIL/uL — ABNORMAL LOW (ref 3.87–5.11)
Retic Ct Pct: 1.1 % (ref 0.4–3.1)

## 2012-08-15 LAB — IRON AND TIBC
Saturation Ratios: 3 % — ABNORMAL LOW (ref 20–55)
TIBC: 394 ug/dL (ref 250–470)

## 2012-08-15 LAB — FERRITIN: Ferritin: 3 ng/mL — ABNORMAL LOW (ref 10–291)

## 2012-08-15 LAB — VITAMIN B12: Vitamin B-12: 373 pg/mL (ref 211–911)

## 2012-08-15 LAB — TSH: TSH: 1.908 u[IU]/mL (ref 0.350–4.500)

## 2012-08-15 LAB — PREPARE RBC (CROSSMATCH)

## 2012-08-15 MED ORDER — OXYCODONE-ACETAMINOPHEN 5-325 MG PO TABS
1.0000 | ORAL_TABLET | ORAL | Status: DC | PRN
  Administered 2012-08-15 (×3): 2 via ORAL
  Filled 2012-08-15 (×3): qty 2

## 2012-08-15 MED ORDER — INTEGRA PLUS PO CAPS: 1.0000 | ORAL_CAPSULE | Freq: Every day | ORAL | Status: AC

## 2012-08-15 MED ORDER — ACETAMINOPHEN 325 MG PO TABS: 650.0000 mg | ORAL_TABLET | ORAL | Status: DC | PRN

## 2012-08-15 MED ORDER — MEDROXYPROGESTERONE ACETATE 10 MG PO TABS: 20.0000 mg | ORAL_TABLET | Freq: Every day | ORAL | Status: DC

## 2012-08-15 MED ORDER — ACETAMINOPHEN 325 MG PO TABS
650.0000 mg | ORAL_TABLET | Freq: Once | ORAL | Status: AC
  Administered 2012-08-15: 650 mg via ORAL
  Filled 2012-08-15: qty 2

## 2012-08-15 MED ORDER — HYDROCHLOROTHIAZIDE 25 MG PO TABS: 25.0000 mg | ORAL_TABLET | Freq: Every day | ORAL | Status: DC

## 2012-08-15 MED ORDER — DOCUSATE SODIUM 100 MG PO CAPS: 100.0000 mg | ORAL_CAPSULE | Freq: Two times a day (BID) | ORAL | Status: DC | PRN

## 2012-08-15 MED ORDER — ONDANSETRON HCL 4 MG/2ML IJ SOLN: 4.0000 mg | Freq: Four times a day (QID) | INTRAMUSCULAR | Status: DC | PRN

## 2012-08-15 MED ORDER — DIPHENHYDRAMINE HCL 25 MG PO CAPS
25.0000 mg | ORAL_CAPSULE | Freq: Once | ORAL | Status: AC
  Administered 2012-08-15: 25 mg via ORAL
  Filled 2012-08-15: qty 1

## 2012-08-15 MED ORDER — ONDANSETRON HCL 4 MG PO TABS: 4.0000 mg | ORAL_TABLET | Freq: Four times a day (QID) | ORAL | Status: DC | PRN

## 2012-08-15 MED ORDER — IBUPROFEN 600 MG PO TABS: 600.0000 mg | ORAL_TABLET | Freq: Four times a day (QID) | ORAL | Status: DC | PRN

## 2012-08-15 NOTE — Discharge Summary (Signed)
Physician Discharge Summary  Patient ID: Gabriela Perez MRN: [id number] DOB/AGE: [birth year] 32 y.o.  Admit date: 08/14/2012 Discharge date: 08/15/2012  Admission Diagnoses: anemia  Discharge Diagnoses:  Active Problems:   Elevated blood pressure   Menorrhagia   Anemia due to chronic blood loss   Discharged Condition: good  Hospital Course: Pt was admitted overnight for symptomatic anemia.  Her hct was %25.  She reports that she has very heavy cycles and uses ~10pads/day for 5 days out of each months.  She denies other bleeding.  She c/o still feeling slightly dizzy.  She has been taking FeSO4 at home tid.  She was seen by the chiropracter 1-2 days previously and was told that her BP was 170's over high 100's.  She has not been treated for elevated BP's in the past.    Consults: None  Significant Diagnostic Studies: labs: CBC  CBC    Component Value Date/Time   WBC 4.2 08/15/2012 0930   RBC 4.20 08/15/2012 0930   HGB 8.2* 08/15/2012 0930   HCT 27.2* 08/15/2012 0930   PLT 411* 08/15/2012 0930   MCV 64.8* 08/15/2012 0930   MCH 19.5* 08/15/2012 0930   MCHC 30.1 08/15/2012 0930   RDW 21.7* 08/15/2012 0930   LYMPHSABS 1.8 08/14/2012 2034   MONOABS 0.6 08/14/2012 2034   EOSABS 0.1 08/14/2012 2034   BASOSABS 0.1 08/14/2012 2034      Treatments: IV hydration and procedures: blood transfusion  Discharge Exam: Blood pressure 139/91, pulse 64, temperature 98.1 F (36.7 C), temperature source Oral, resp. rate 16, height 5\' 8"  (1.727 m), weight 303 lb (137.44 kg), last menstrual period 07/30/2012, SpO2 97.00%. General appearance: alert and no distress Resp: clear to auscultation bilaterally Cardio: regular rate and rhythm, S1, S2 normal, no murmur, click, rub or gallop GI: soft, non-tender; bowel sounds normal; no masses,  no organomegaly  sono 08/09/2012 TRANSABDOMINAL AND TRANSVAGINAL ULTRASOUND OF PELVIS  Technique: Both transabdominal and transvaginal ultrasound  examinations of the  pelvis were performed. Transabdominal technique  was performed for global imaging of the pelvis including uterus,  ovaries, adnexal regions, and pelvic cul-de-sac.  It was necessary to proceed with endovaginal exam following the  transabdominal exam to visualize the ovaries.  Comparison: CT abdomen and pelvis 03/02/2012. Pelvic ultrasound  08/26/2007.  Findings:  Uterus: The uterus is enlarged, measuring 16.1 x 5.0 x 6.6 cm. At  least two large fibroids are evident. A fibroid in the lower  uterine segment is 2.8 x 2.8 x 3.0 cm. A more fundal fibroid  measures 2.5 x 2.1 x 2.3 cm.  Endometrium: Normal in size. The maximal thickness is 9.4 mm.  Right ovary: The right ovary is of normal size and echotexture  measuring 2.8 x 1.9 x 1.5 cm.  Left ovary: The left ovary measures 3.3 x 2.2 x 2.6 cm. A para  ovarian cyst measures 2.4 x 1.8 x 1.3 cm.  Other findings: No free fluid  IMPRESSION:  1. Increasing size of uterine fibroids.  2. 2.4 cm left para ovarian cyst.  Disposition: 01-Home or Self Care  Discharge Orders   Future Appointments Provider Department Dept Phone   09/08/2012 8:45 AM Tereso Newcomer, MD [facility] [phone]   Future Orders Complete By Expires     Call MD for:  difficulty breathing, headache or visual disturbances  As directed     Call MD for:  extreme fatigue  As directed     Call MD for:  hives  As  directed     Call MD for:  persistant dizziness or light-headedness  As directed     Call MD for:  temperature >100.4  As directed     Diet - low sodium heart healthy  As directed     Increase activity slowly  As directed         Medication List    STOP taking these medications       ketorolac 10 MG tablet  Commonly known as:  TORADOL     oxyCODONE-acetaminophen 5-325 MG per tablet  Commonly known as:  PERCOCET/ROXICET      TAKE these medications       ferrous sulfate 325 (65 FE) MG tablet  Take 325 mg by mouth 3 (three) times daily with  meals.     hydrochlorothiazide 25 MG tablet  Commonly known as:  HYDRODIURIL  Take 1 tablet (25 mg total) by mouth daily.     ibuprofen 600 MG tablet  Commonly known as:  ADVIL,MOTRIN  Take 1 tablet (600 mg total) by mouth every 6 (six) hours as needed for pain.     INTEGRA PLUS Caps  Take 1 capsule by mouth daily.     medroxyPROGESTERone 10 MG tablet  Commonly known as:  PROVERA  Take 2 tablets (20 mg total) by mouth daily.     nystatin-triamcinolone ointment  Commonly known as:  MYCOLOG  Apply topically 2 (two) times daily.           Follow-up Information   Follow up with [facility] On 09/08/2012. (as previously scheduled)     A/ symptomatic anemia thought due to chronic blood loss.  Hx significant for menorrhagia and fibroids- d/w pt long term options of treatment for menorrhagia including Depo Provera, IUD and Provera.     Elevated blood pressures  P/ Provera 20mg  po q day HCTZ 25 mg po q day F/u GYN clinic as scheduled 09/08/2012 Integra 1 po q day (pt aware that her insurance will not cover this)     Signed: HARRAWAY-SMITH, Kaimani Clayson 08/15/2012, 11:02 AM

## 2012-08-15 NOTE — Progress Notes (Signed)
Pt is discharged in the care of friend. Downstairs per ambulatory. Denies any vaginal  Bleeding. Stable. States she feels better. After transfusion.Marland Kitchen Discharge instructions were given to pt,with good understanding. Questions were asked and answered.

## 2012-08-15 NOTE — Progress Notes (Signed)
UR completed 

## 2012-08-15 NOTE — H&P (Signed)
Patient name: Gabriela Perez Medical record number: [id number] Date of birth: 09-29-1980 Age: 32 y.o. Gender: female  Primary Care Provider: No primary provider on file.  Chief Complaint: anemia, syncope  History of Present Illness: Gabriela Perez is a 32 y.o. year old female G1P1001 presenting with symptomatic anemia. Pt was seen at [facility] and transferred to [facility] as a direct admission.  Today went to chiropractor and blood pressure was 172/116. They advised her to follow up with her PCP, but patient has no PCP. She tried calling Dr. Marlyne Beards office in [facility] but they were not able to see her today. She laid down and woke up at 5:30pm to take a shower. When she got out of the shower she felt dizzy and fell down. She did not hit her head. Her friend arrived and pt was reportedly incoherent. They called EMS, who came and did an EKG. She is still dizzy now.  On 4/9 was when she was first diagnosed with anemia, according to patient. She started taking iron 325mg  TID at that time. She gets periods every 23 days. They last about 5 days and are heavy. Last period ended on the third of April. Has never had a blood transfusion before.  She also reports pain in her right lower quadrant which started earlier this evening before she got in the shower. It's currently 6/10. She reports she was given IV toradol in the ER.  She was seen here on Friday in the MAU for LLQ pain.  She had 2 cysts on her left ovary. During that visit she had a GC/chlamydia swab obtained, which was positive for gonorrhea. [facility]. Her partner also got treated. Denies any current vaginal discharge. No prior history of STD's. She is currently sexually active with one female partner. No other partners in the last year.   Review Of Systems: Per HPI. Otherwise 12 point review of systems was performed and was unremarkable. No chest, vision changes, ear pain, sore throat,  shortness of breath, dysuria, swelling in legs, problems with urination.   Past Medical History: Past Medical History  Diagnosis Date  . Anemia   Denies ever being diagnosed with hypertension  Past Surgical History: Past Surgical History  Procedure Laterality Date  . Cesarean section      Home Medications: No current facility-administered medications on file prior to encounter.   Current Outpatient Prescriptions on File Prior to Encounter  Medication Sig Dispense Refill  . ferrous sulfate 325 (65 FE) MG tablet Take 325 mg by mouth 3 (three) times daily with meals.      Marland Kitchen ketorolac (TORADOL) 10 MG tablet Take 1 tablet (10 mg total) by mouth every 6 (six) hours as needed for pain.  20 tablet  0  . nystatin-triamcinolone ointment (MYCOLOG) Apply topically 2 (two) times daily.  30 g  1  . oxyCODONE-acetaminophen (PERCOCET/ROXICET) 5-325 MG per tablet Take 1-2 tablets by mouth every 4 (four) hours as needed for pain.  40 tablet  0    Social History: History  Substance Use Topics  . Smoking status: Never Smoker   . Smokeless tobacco: Not on file  . Alcohol Use: No   For any additional social history documentation, please refer to relevant sections of EMR.  Family History: Family History  Problem Relation Age of Onset  . Hypertension Mother   . Diabetes Mother   . Hypertension Father    Allergies: No Known Allergies  Physical Exam:  BP 147/91  Pulse 81  Temp(Src) 98 F (36.7 C) (Oral)  Resp 18  Ht 5\' 8"  (1.727 m)  Wt 303 lb (137.44 kg)  BMI 46.08 kg/m2  SpO2 99%  LMP 07/30/2012 Exam: General: NAD, pleasant and cooperative HEENT: moist mucous membranes Cardiovascular: RRR, no murmur appreciated Respiratory: NWOB, CTAB Abdomen: soft. Tender to palpation of RLQ. No rebound tenderness Extremities: calves nontender to palpation. No appreciable LE edema. Brisk capillary refill Neuro: grossly nonfocal, speech intact  Labs and Imaging:  CBC:    Component Value  Date/Time   WBC 5.6 08/14/2012 2034   HGB 7.4* 08/14/2012 2034   HCT 25.6* 08/14/2012 2034   PLT 520* 08/14/2012 2034   MCV 60.7* 08/14/2012 2034   NEUTROABS 3.0 08/14/2012 2034   LYMPHSABS 1.8 08/14/2012 2034   MONOABS 0.6 08/14/2012 2034   EOSABS 0.1 08/14/2012 2034   BASOSABS 0.1 08/14/2012 2034   Comprehensive Metabolic Panel:    Component Value Date/Time   NA 136 08/14/2012 2034   K 3.4* 08/14/2012 2034   CL 102 08/14/2012 2034   CO2 23 08/14/2012 2034   BUN 6 08/14/2012 2034   CREATININE 0.50 08/14/2012 2034   GLUCOSE 90 08/14/2012 2034   CALCIUM 8.9 08/14/2012 2034   Urinalysis    Component Value Date/Time   COLORURINE YELLOW 08/14/2012 2027   APPEARANCEUR CLEAR 08/14/2012 2027   LABSPEC 1.010 08/14/2012 2027   PHURINE 7.5 08/14/2012 2027   GLUCOSEU NEGATIVE 08/14/2012 2027   HGBUR NEGATIVE 08/14/2012 2027   BILIRUBINUR NEGATIVE 08/14/2012 2027   KETONESUR NEGATIVE 08/14/2012 2027   PROTEINUR NEGATIVE 08/14/2012 2027   UROBILINOGEN 1.0 08/14/2012 2027   NITRITE NEGATIVE 08/14/2012 2027   LEUKOCYTESUR NEGATIVE 08/14/2012 2027   Pelvic Ultrasound 4/9: Uterus: The uterus is enlarged, measuring 16.1 x 5.0 x 6.6 cm. At least two large fibroids are evident. A fibroid in the lower uterine segment is 2.8 x 2.8 x 3.0 cm. A more fundal fibroid measures 2.5 x 2.1 x 2.3 cm.  Endometrium: Normal in size. The maximal thickness is 9.4 mm.  Right ovary: The right ovary is of normal size and echotexture measuring 2.8 x 1.9 x 1.5 cm.  Left ovary: The left ovary measures 3.3 x 2.2 x 2.6 cm. A para ovarian cyst measures 2.4 x 1.8 x 1.3 cm.  Other findings: No free fluid  IMPRESSION:  1. Increasing size of uterine fibroids.  2. 2.4 cm left para ovarian cyst.   Assessment and Plan: Gabriela Perez is a 32 y.o. year old female presenting with a syncopal episode, presumably due to symptomatic anemia.  # Symptomatic microcytic anemia - likely iron deficiency anemia secondary to menorrhagia (MCV 60)  -  transfuse two units PRBC now - check TSH, iron, TIBC, ferritin, B12, folate - recheck CBC 2 hours post-transfusion  # Elevated blood pressure: - continue to monitor vitals closely - will consider starting medication if elevation in BP persists  # RLQ pain:  - possibly secondary to gonorrhea infection (patient was treated earlier today) - continue to monitor for signs of fever, worsening pain - tylenol or oxycodone prn severe pain - right ovary unremarkable on u/s on 4/9  # FEN/GI: - regular diet - saline lock IV  # Prophylaxis: - SCD's  # Dispo:  - pending clinical improvement  Levert Feinstein, MD Family Medicine PGY-1  I saw and examined patient and agree with above resident note. I reviewed history, imaging, labs, and vitals. Enlarged uterus and fibroids on ultrasound suggests  likely cause of bleeding.  Napoleon Form, MD

## 2012-08-15 NOTE — Progress Notes (Signed)
Patient ID: Gabriela Perez, female   DOB: 10-26-1980, 32 y.o.   MRN: [id number] Called to see pt because pts partner is here and pts is stating that her issues of pain and bleeding were not addressed.  I reviewed with patient and partner our prior conversation about the menorrhagia and the treatment options; elevated BP and starting HCTZ; pain control with NSAIDS (as pt stated that the Percocet only makes her sleepy); I also reviewed with her again the results of the sono.  The nurse was present for the conversation. The patient reports that she was clear now and will f/u as previously scheduled in the GYN office.  Leeasia Secrist L. Harraway-Smith, M.D., [place] [place]

## 2012-08-15 NOTE — ED Provider Notes (Signed)
Medical screening examination/treatment/procedure(s) were performed by non-physician practitioner and as supervising physician I was immediately available for consultation/collaboration.   Jamie Belger B. Bernette Mayers, MD 08/15/12 2048

## 2012-08-16 ENCOUNTER — Telehealth: Payer: Self-pay | Admitting: Family Medicine

## 2012-08-16 LAB — TYPE AND SCREEN
Antibody Screen: NEGATIVE
Unit division: 0

## 2012-08-16 LAB — URINE CULTURE: Colony Count: >=100000

## 2012-08-16 MED ORDER — SULFAMETHOXAZOLE-TRIMETHOPRIM 800-160 MG PO TABS: 1.0000 | ORAL_TABLET | Freq: Two times a day (BID) | ORAL | Status: DC

## 2012-08-16 NOTE — Progress Notes (Signed)
Received a call from SW stating that pt had called and needed assistance obtaining her medications.  Pt was dc'd from hospital 4/15 and then notified today that she would need to take Bactrim for a UTI.  I called and spoke w/ the pt at [phone].  Pt stated that she did need assistance w/ obtaining her medications.  She stated that the [facility]'s [facility] on [address]  [place] and the [facility] medication assistance program is not affiliated w[facility] that [facility].  [facility] [address] [facility] [place].  I spoke w/ pharmacist at that location and she will have rx's transferred to their location.  I called and notified the pt that her medications were being transferred and that there should be no copay.  Explained that this medication assistance program was valid for use one time per year  Pt voiced understanding and will work on having her Medicaid approved.  T[place], RNBSN   [phone]

## 2012-08-16 NOTE — Telephone Encounter (Signed)
Called patient to let her know her urine shows a UTI. Will send in Bactrim DS BID x 5 days to her pharmacy. She confirms no drug allergies.

## 2012-08-17 NOTE — H&P (Signed)
Agree with above note.  Gabriela Perez H. 08/17/2012 3:10 PM

## 2012-08-22 ENCOUNTER — Encounter (HOSPITAL_COMMUNITY): Payer: Self-pay | Admitting: *Deleted

## 2012-08-22 ENCOUNTER — Emergency Department (HOSPITAL_COMMUNITY)
Admission: EM | Admit: 2012-08-22 | Discharge: 2012-08-22 | Disposition: A | Discharge status: Home / Self Care | Payer: Medicaid Other | Attending: Emergency Medicine | Admitting: Emergency Medicine

## 2012-08-22 DIAGNOSIS — R350 Frequency of micturition: Secondary | ICD-10-CM | POA: Insufficient documentation

## 2012-08-22 DIAGNOSIS — D649 Anemia, unspecified: Secondary | ICD-10-CM | POA: Insufficient documentation

## 2012-08-22 DIAGNOSIS — R300 Dysuria: Secondary | ICD-10-CM | POA: Insufficient documentation

## 2012-08-22 DIAGNOSIS — M25539 Pain in unspecified wrist: Secondary | ICD-10-CM | POA: Insufficient documentation

## 2012-08-22 DIAGNOSIS — I808 Phlebitis and thrombophlebitis of other sites: Secondary | ICD-10-CM | POA: Insufficient documentation

## 2012-08-22 DIAGNOSIS — M25521 Pain in right elbow: Secondary | ICD-10-CM

## 2012-08-22 DIAGNOSIS — Z87440 Personal history of urinary (tract) infections: Secondary | ICD-10-CM | POA: Insufficient documentation

## 2012-08-22 DIAGNOSIS — IMO0001 Reserved for inherently not codable concepts without codable children: Secondary | ICD-10-CM | POA: Insufficient documentation

## 2012-08-22 DIAGNOSIS — I809 Phlebitis and thrombophlebitis of unspecified site: Secondary | ICD-10-CM

## 2012-08-22 DIAGNOSIS — R700 Elevated erythrocyte sedimentation rate: Secondary | ICD-10-CM | POA: Insufficient documentation

## 2012-08-22 DIAGNOSIS — Z3202 Encounter for pregnancy test, result negative: Secondary | ICD-10-CM | POA: Insufficient documentation

## 2012-08-22 LAB — URINALYSIS, ROUTINE W REFLEX MICROSCOPIC
Bilirubin Urine: NEGATIVE
Leukocytes, UA: NEGATIVE
Nitrite: NEGATIVE
Specific Gravity, Urine: 1.033 — ABNORMAL HIGH (ref 1.005–1.030)
Urobilinogen, UA: 1 mg/dL (ref 0.0–1.0)
pH: 5.5 (ref 5.0–8.0)

## 2012-08-22 LAB — CBC WITH DIFFERENTIAL/PLATELET
Basophils Absolute: 0.1 10*3/uL (ref 0.0–0.1)
Lymphs Abs: 1.7 10*3/uL (ref 0.7–4.0)
MCV: 65 fL — ABNORMAL LOW (ref 78.0–100.0)
Monocytes Absolute: 0.5 10*3/uL (ref 0.1–1.0)
Monocytes Relative: 10 % (ref 3–12)
Neutrophils Relative %: 53 % (ref 43–77)
Platelets: 378 10*3/uL (ref 150–400)
RDW: 24.2 % — ABNORMAL HIGH (ref 11.5–15.5)
WBC: 5 10*3/uL (ref 4.0–10.5)

## 2012-08-22 LAB — POCT PREGNANCY, URINE: Preg Test, Ur: NEGATIVE

## 2012-08-22 MED ORDER — TRAMADOL HCL 50 MG PO TABS: 50.0000 mg | ORAL_TABLET | Freq: Four times a day (QID) | ORAL | Status: DC | PRN

## 2012-08-22 NOTE — ED Provider Notes (Signed)
Medical screening examination/treatment/procedure(s) were conducted as a shared visit with non-physician practitioner(s) and myself.  I personally evaluated the patient during the encounter.  This with diffuse right elbow pain. She was recently hospitalized and had an IV in the right antecubital fossa. There is tenderness around the area of the IV site without any swelling or cords. No redness or floor, no signs of soft tissue infection or abscess. Symptoms might be secondary to early thrombophlebitis, but patient also having pain and tenderness over the olecranon region. Because of this, septic joint is considered, although felt unlikely. Sedimentation rate was elevated. This might be secondary to phlebitis, but because of the diffuse joint pain, patient will followup with orthopedics to rule out infected joint.  Gilda Crease, MD 08/22/12 2045

## 2012-08-22 NOTE — ED Provider Notes (Signed)
History     CSN: [id number]  Arrival date & time 08/22/12  1556   First MD Initiated Contact with Patient 08/22/12 1703      Chief Complaint  Patient presents with  . Arm Problem  . Urinary Frequency    (Consider location/radiation/quality/duration/timing/severity/associated sxs/prior treatment) HPI Gabriela Perez is a 32 y.o. female who presents to ED with complaint of right elbow pain and pain in right antecubital fossa. States had blood transfusion a week ago, through an IV in the right arm, where pain is. Pain began 3 days ago. Pain radiates down to the fingers and up to the shoulder. Pain is sharp, stabbing, worsened with movement of the elbow joint. Pt has not taken anything for this pain. Pt denies fever, chills, malaise, no other complaints. Pt states also having pain with urination. States was treated for UTI a week ago with bactrim. Finished prescription but symptoms not improving. No flank pain, no urinary frequency or urgency.    Past Medical History  Diagnosis Date  . Anemia     Past Surgical History  Procedure Laterality Date  . Cesarean section  2003    Family History  Problem Relation Age of Onset  . Hypertension Mother   . Diabetes Mother   . Hypertension Father     History  Substance Use Topics  . Smoking status: Never Smoker   . Smokeless tobacco: Not on file  . Alcohol Use: No    OB History   Grav Para Term Preterm Abortions TAB SAB Ect Mult Living   1 1 1  0 0 0 0 0 0 1      Review of Systems  Constitutional: Negative for fever and chills.  HENT: Negative for neck pain and neck stiffness.   Respiratory: Negative.   Cardiovascular: Negative.   Gastrointestinal: Negative for nausea, vomiting and abdominal pain.  Genitourinary: Positive for dysuria. Negative for urgency, hematuria, flank pain, vaginal bleeding, vaginal discharge, vaginal pain, menstrual problem and pelvic pain.  Musculoskeletal: Positive for myalgias and arthralgias.  Skin:  Positive for wound.  Neurological: Negative for dizziness, weakness, numbness and headaches.    Allergies  Latex  Home Medications   Current Outpatient Rx  Name  Route  Sig  Dispense  Refill  . FeFum-FePoly-FA-B Cmp-C-Biot (INTEGRA PLUS) CAPS   Oral   Take 1 capsule by mouth daily.   30 capsule   3   . hydrochlorothiazide (HYDRODIURIL) 25 MG tablet   Oral   Take 1 tablet (25 mg total) by mouth daily.   30 tablet   2   . medroxyPROGESTERone (PROVERA) 10 MG tablet   Oral   Take 2 tablets (20 mg total) by mouth daily.   30 tablet   2   . sulfamethoxazole-trimethoprim (BACTRIM DS,SEPTRA DS) 800-160 MG per tablet   Oral   Take 1 tablet by mouth 2 (two) times daily.   10 tablet   0     BP 128/80  Pulse 82  Temp(Src) 99.3 F (37.4 C) (Oral)  Resp 18  SpO2 100%  LMP 07/30/2012  Physical Exam  Nursing note and vitals reviewed. Constitutional: She appears well-developed and well-nourished. No distress.  HENT:  Head: Normocephalic.  Eyes: Conjunctivae are normal.  Neck: Neck supple.  Cardiovascular: Normal rate, regular rhythm and normal heart sounds.   Pulmonary/Chest: Effort normal and breath sounds normal. No respiratory distress. She has no wheezes. She has no rales.  Abdominal: Soft. Bowel sounds are normal. She exhibits no distension. There  is no tenderness. There is no rebound.  Musculoskeletal:  Small <1cm erythematous mark to the right antecubital fossa from prior iv site with no surrounding swelling, erythema, no vein stenosis. Normal right elbow on appearance with no erythema, not warm to the touch. Full ROM of the joint however is painful with both flexion and extension. Tender over entire joint with most tenderness over olecranon. Tenderness extends midway through forearm and up the upper arm. Normal distal radial pulse, normal cap refill in all fingers.   Neurological: She is alert.  Skin: Skin is warm and dry.  Psychiatric: She has a normal mood and  affect. Her behavior is normal.    ED Course  Procedures (including critical care time)  Pt with right elbow pay and dysuria. No elbow injuies. Exam unremarkable, no signs of infection however pain is out of proportion to the findings and worse with rom. Will get  CBC and sed rate  Results for orders placed during the hospital encounter of 08/22/12  URINALYSIS, ROUTINE W REFLEX MICROSCOPIC      Result Value Range   Color, Urine AMBER (*) YELLOW   APPearance CLOUDY (*) CLEAR   Specific Gravity, Urine 1.033 (*) 1.005 - 1.030   pH 5.5  5.0 - 8.0   Glucose, UA NEGATIVE  NEGATIVE mg/dL   Hgb urine dipstick NEGATIVE  NEGATIVE   Bilirubin Urine NEGATIVE  NEGATIVE   Ketones, ur NEGATIVE  NEGATIVE mg/dL   Protein, ur NEGATIVE  NEGATIVE mg/dL   Urobilinogen, UA 1.0  0.0 - 1.0 mg/dL   Nitrite NEGATIVE  NEGATIVE   Leukocytes, UA NEGATIVE  NEGATIVE  CBC WITH DIFFERENTIAL      Result Value Range   WBC 5.0  4.0 - 10.5 K/uL   RBC 4.91  3.87 - 5.11 MIL/uL   Hemoglobin 9.5 (*) 12.0 - 15.0 g/dL   HCT 16.1 (*) 09.6 - 04.5 %   MCV 65.0 (*) 78.0 - 100.0 fL   MCH 19.3 (*) 26.0 - 34.0 pg   MCHC 29.8 (*) 30.0 - 36.0 g/dL   RDW 40.9 (*) 81.1 - 91.4 %   Platelets 378  150 - 400 K/uL   Neutrophils Relative 53  43 - 77 %   Lymphocytes Relative 34  12 - 46 %   Monocytes Relative 10  3 - 12 %   Eosinophils Relative 2  0 - 5 %   Basophils Relative 1  0 - 1 %   Neutro Abs 2.6  1.7 - 7.7 K/uL   Lymphs Abs 1.7  0.7 - 4.0 K/uL   Monocytes Absolute 0.5  0.1 - 1.0 K/uL   Eosinophils Absolute 0.1  0.0 - 0.7 K/uL   Basophils Absolute 0.1  0.0 - 0.1 K/uL   RBC Morphology BURR CELLS     Smear Review PLATELET COUNT CONFIRMED BY SMEAR    SEDIMENTATION RATE      Result Value Range   Sed Rate 40 (*) 0 - 22 mm/hr  POCT PREGNANCY, URINE      Result Value Range   Preg Test, Ur NEGATIVE  NEGATIVE     8:41 PM Normal WBC. Sed rate up at 40, discussed with Dr. Amanda Pea, still suspect most likely superficial  phlebitis as the cause, but he will see her for recheck tomorrow.   1. Elbow joint pain, right   2. Superficial phlebitis   3. Elevated sed rate       MDM  UA negative. Exam consistent with superficial phlebitis.  Sed rate elevated, some concern for possible septic joint, however, normal WBC, afebrile, elbow not warm, no cellulitis skin changes. Pt will be dc home with ultram for pain and close follow up with Dr. Amanda Pea.   Filed Vitals:   08/22/12 1605  BP: 128/80  Pulse: 82  Temp: 99.3 F (37.4 C)  TempSrc: Oral  Resp: 18  SpO2: 100%           Lottie Mussel, PA-C 08/22/12 2043

## 2012-08-22 NOTE — ED Notes (Signed)
Pt c/o right arm pain, reports had a blood transfusion recently and has been having pain to right arm since then. Also reports when she bends her right arm there is a sharp shooting pain. Pt also c/o urinary frequency. States was being treated for UTI but symptoms persist.

## 2012-09-08 ENCOUNTER — Encounter: Payer: Self-pay | Admitting: Obstetrics & Gynecology

## 2012-09-29 ENCOUNTER — Encounter: Payer: Self-pay | Admitting: Obstetrics & Gynecology

## 2012-09-29 ENCOUNTER — Other Ambulatory Visit (HOSPITAL_COMMUNITY)
Admission: RE | Admit: 2012-09-29 | Discharge: 2012-09-29 | Disposition: A | Discharge status: Home / Self Care | Payer: Medicaid Other | Source: Ambulatory Visit | Attending: Obstetrics & Gynecology | Admitting: Obstetrics & Gynecology

## 2012-09-29 ENCOUNTER — Ambulatory Visit (INDEPENDENT_AMBULATORY_CARE_PROVIDER_SITE_OTHER): Payer: Medicaid Other | Admitting: Obstetrics & Gynecology

## 2012-09-29 VITALS — BP 149/104 | HR 76 | Temp 97.8°F | Ht 67.5 in | Wt 304.5 lb

## 2012-09-29 DIAGNOSIS — D259 Leiomyoma of uterus, unspecified: Secondary | ICD-10-CM

## 2012-09-29 DIAGNOSIS — D219 Benign neoplasm of connective and other soft tissue, unspecified: Secondary | ICD-10-CM

## 2012-09-29 DIAGNOSIS — Z1151 Encounter for screening for human papillomavirus (HPV): Secondary | ICD-10-CM | POA: Insufficient documentation

## 2012-09-29 DIAGNOSIS — Z01419 Encounter for gynecological examination (general) (routine) without abnormal findings: Secondary | ICD-10-CM | POA: Insufficient documentation

## 2012-09-29 NOTE — Patient Instructions (Signed)
Fibroids Fibroids are lumps (tumors) that can occur any place in a woman's body. These lumps are not cancerous. Fibroids vary in size, weight, and where they grow. HOME CARE  Do not take aspirin.  Write down the number of pads or tampons you use during your period. Tell your doctor. This can help determine the best treatment for you. GET HELP RIGHT AWAY IF:  You have pain in your lower belly (abdomen) that is not helped with medicine.  You have cramps that are not helped with medicine.  You have more bleeding between or during your period.  You feel lightheaded or pass out (faint).  Your lower belly pain gets worse. MAKE SURE YOU:  Understand these instructions.  Will watch your condition.  Will get help right away if you are not doing well or get worse. Document Released: 05/22/2010 Document Revised: 07/12/2011 Document Reviewed: 05/22/2010 ExitCare Patient Information 2014 [employer].  

## 2012-09-29 NOTE — Progress Notes (Signed)
Patient ID: Gabriela Perez, female   DOB: 1980-08-12, 32 y.o.   MRN: [id number]  Chief Complaint  Patient presents with  . Follow-up    ovarian cyst, vaginal bleeding    HPI Gabriela Perez is a 32 y.o. female.  G1P1001 Patient's last menstrual period was 09/19/2012. increasing pelvic pain, menorrhagia and dysmenorrhea, US shows fibroids. Same sex relationship, does not need BCM   HPI  Past Medical History  Diagnosis Date  . Anemia   . Hypertension     Past Surgical History  Procedure Laterality Date  . Cesarean section  2003    Family History  Problem Relation Age of Onset  . Hypertension Mother   . Diabetes Mother   . Hypertension Father     Social History History  Substance Use Topics  . Smoking status: Never Smoker   . Smokeless tobacco: Never Used  . Alcohol Use: No  Lesbian relationship by her report  Allergies  Allergen Reactions  . Latex Itching    Current Outpatient Prescriptions  Medication Sig Dispense Refill  . FeFum-FePoly-FA-B Cmp-C-Biot (INTEGRA PLUS) CAPS Take 1 capsule by mouth daily.  30 capsule  3  . hydrochlorothiazide (HYDRODIURIL) 25 MG tablet Take 1 tablet (25 mg total) by mouth daily.  30 tablet  2  . medroxyPROGESTERone (PROVERA) 10 MG tablet Take 2 tablets (20 mg total) by mouth daily.  30 tablet  2  . sulfamethoxazole-trimethoprim (BACTRIM DS,SEPTRA DS) 800-160 MG per tablet Take 1 tablet by mouth 2 (two) times daily.  10 tablet  0  . traMADol (ULTRAM) 50 MG tablet Take 1 tablet (50 mg total) by mouth every 6 (six) hours as needed for pain.  15 tablet  0   No current facility-administered medications for this visit.    Review of Systems Review of Systems  Gastrointestinal: Positive for abdominal distention. Negative for nausea.  Genitourinary: Positive for menstrual problem and pelvic pain. Negative for vaginal bleeding and vaginal discharge.    Blood pressure 149/104, pulse 76, temperature 97.8 F (36.6 C), temperature  source Oral, height 5' 7.5" (1.715 m), weight 304 lb 8 oz (138.12 kg), last menstrual period 09/19/2012.  Physical Exam Physical Exam  Constitutional: She is oriented to person, place, and time. No distress.  obese  Abdominal: She exhibits no distension and no mass. There is no tenderness.  Obese, pannous  Genitourinary: No vaginal discharge found.  Uterus 8 weeks size, no masses Pap done  Neurological: She is alert and oriented to person, place, and time.  Skin: Skin is warm and dry.  Psychiatric: She has a normal mood and affect. Her behavior is normal.    Data Reviewed  *RADIOLOGY REPORT*  Clinical Data: Pelvic pain.  TRANSABDOMINAL AND TRANSVAGINAL ULTRASOUND OF PELVIS  Technique: Both transabdominal and transvaginal ultrasound  examinations of the pelvis were performed. Transabdominal technique  was performed for global imaging of the pelvis including uterus,  ovaries, adnexal regions, and pelvic cul-de-sac.  It was necessary to proceed with endovaginal exam following the  transabdominal exam to visualize the ovaries.  Comparison: CT abdomen and pelvis 03/02/2012. Pelvic ultrasound  08/26/2007.  Findings:  Uterus: The uterus is enlarged, measuring 16.1 x 5.0 x 6.6 cm. At  least two large fibroids are evident. A fibroid in the lower  uterine segment is 2.8 x 2.8 x 3.0 cm. A more fundal fibroid  measures 2.5 x 2.1 x 2.3 cm.  Endometrium: Normal in size. The maximal thickness is 9.4 mm.  Right ovary: The right  ovary is of normal size and echotexture  measuring 2.8 x 1.9 x 1.5 cm.  Left ovary: The left ovary measures 3.3 x 2.2 x 2.6 cm. A para  ovarian cyst measures 2.4 x 1.8 x 1.3 cm.  Other findings: No free fluid  IMPRESSION:  1. Increasing size of uterine fibroids.  2. 2.4 cm left para ovarian cyst.  Original Report Authenticated By: Marin Roberts, M.D. 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

## 2012-10-02 ENCOUNTER — Telehealth: Payer: Self-pay

## 2012-10-02 NOTE — Telephone Encounter (Signed)
Called pt to inform her that radiology called and informed me that her appt for a sonohysterogram will be need to be scheduled for 7-10 days from the day she starts her period.  I gave the pt the # to radiology so that she could call and schedule the appt on the day that she starts her period.  Pt stated understanding.  I informed her that she does need to attend the appt that was scheduled for her on Thursday.  Pt stated "ok, thank you."

## 2012-10-05 ENCOUNTER — Ambulatory Visit (HOSPITAL_COMMUNITY): Payer: Medicaid Other

## 2012-10-23 ENCOUNTER — Ambulatory Visit (HOSPITAL_COMMUNITY): Admission: RE | Admit: 2012-10-23 | Payer: Medicaid Other | Source: Ambulatory Visit

## 2012-10-23 ENCOUNTER — Emergency Department (HOSPITAL_BASED_OUTPATIENT_CLINIC_OR_DEPARTMENT_OTHER)
Admission: EM | Admit: 2012-10-23 | Discharge: 2012-10-23 | Disposition: A | Discharge status: Home / Self Care | Payer: Medicaid Other | Attending: Emergency Medicine | Admitting: Emergency Medicine

## 2012-10-23 ENCOUNTER — Encounter (HOSPITAL_BASED_OUTPATIENT_CLINIC_OR_DEPARTMENT_OTHER): Payer: Self-pay

## 2012-10-23 DIAGNOSIS — H9209 Otalgia, unspecified ear: Secondary | ICD-10-CM | POA: Insufficient documentation

## 2012-10-23 DIAGNOSIS — I10 Essential (primary) hypertension: Secondary | ICD-10-CM | POA: Insufficient documentation

## 2012-10-23 DIAGNOSIS — Z862 Personal history of diseases of the blood and blood-forming organs and certain disorders involving the immune mechanism: Secondary | ICD-10-CM | POA: Insufficient documentation

## 2012-10-23 DIAGNOSIS — Z91040 Latex allergy status: Secondary | ICD-10-CM | POA: Insufficient documentation

## 2012-10-23 DIAGNOSIS — K219 Gastro-esophageal reflux disease without esophagitis: Secondary | ICD-10-CM | POA: Insufficient documentation

## 2012-10-23 DIAGNOSIS — H9201 Otalgia, right ear: Secondary | ICD-10-CM

## 2012-10-23 MED ORDER — PANTOPRAZOLE SODIUM 20 MG PO TBEC: 20.0000 mg | DELAYED_RELEASE_TABLET | Freq: Every day | ORAL | Status: DC

## 2012-10-23 NOTE — ED Provider Notes (Signed)
History    CSN: [id number] Arrival date & time 10/23/12  1701  First MD Initiated Contact with Patient 10/23/12 1727     Chief Complaint  Patient presents with  . Otalgia  . Abdominal Pain   (Consider location/radiation/quality/duration/timing/severity/associated sxs/prior Treatment) HPI Comments: Pt states that she has a history of reflux but her zantac is not working any more:pt states that she is having a burning and it isn't better until the liquid comes up  Patient is a 32 y.o. female presenting with ear pain and abdominal pain. The history is provided by the patient. No language interpreter was used.  Otalgia Location:  Right Behind ear:  No abnormality Quality:  Aching Severity:  Moderate Duration:  3 days Timing:  Constant Progression:  Unchanged Relieved by:  Nothing Associated symptoms: abdominal pain   Associated symptoms: no cough, no fever and no vomiting   Abdominal Pain This is a new problem. The current episode started today. The problem occurs constantly. The problem has been unchanged. Associated symptoms include abdominal pain. Pertinent negatives include no coughing, fever or vomiting. Nothing aggravates the symptoms. She has tried nothing for the symptoms.   Past Medical History  Diagnosis Date  . Anemia   . Hypertension    Past Surgical History  Procedure Laterality Date  . Cesarean section  2003   Family History  Problem Relation Age of Onset  . Hypertension Mother   . Diabetes Mother   . Hypertension Father    History  Substance Use Topics  . Smoking status: Never Smoker   . Smokeless tobacco: Never Used  . Alcohol Use: No   OB History   Grav Para Term Preterm Abortions TAB SAB Ect Mult Living   1 1 1  0 0 0 0 0 0 1     Review of Systems  Constitutional: Negative for fever.  HENT: Positive for ear pain.   Respiratory: Negative for cough.   Cardiovascular: Negative.   Gastrointestinal: Positive for abdominal pain. Negative for  vomiting.    Allergies  Latex  Home Medications   Current Outpatient Rx  Name  Route  Sig  Dispense  Refill  . FeFum-FePoly-FA-B Cmp-C-Biot (INTEGRA PLUS) CAPS   Oral   Take 1 capsule by mouth daily.   30 capsule   3   . hydrochlorothiazide (HYDRODIURIL) 25 MG tablet   Oral   Take 1 tablet (25 mg total) by mouth daily.   30 tablet   2   . medroxyPROGESTERone (PROVERA) 10 MG tablet   Oral   Take 2 tablets (20 mg total) by mouth daily.   30 tablet   2   . pantoprazole (PROTONIX) 20 MG tablet   Oral   Take 1 tablet (20 mg total) by mouth daily.   30 tablet   0    BP 145/95  Pulse 70  Temp(Src) 98.8 F (37.1 C) (Oral)  Resp 18  Ht 5\' 8"  (1.727 m)  Wt 301 lb (136.533 kg)  BMI 45.78 kg/m2  SpO2 100%  LMP 10/15/2012 Physical Exam  Nursing note and vitals reviewed. Constitutional: She is oriented to person, place, and time. She appears well-developed and well-nourished.  HENT:  Head: Normocephalic and atraumatic.  Right Ear: Tympanic membrane normal.  Left Ear: Tympanic membrane normal.  Mouth/Throat: Oropharynx is clear and moist.  Cardiovascular: Normal rate and regular rhythm.   Pulmonary/Chest: Effort normal and breath sounds normal.  Abdominal: Soft. Bowel sounds are normal. There is tenderness in the epigastric  area.  Musculoskeletal: Normal range of motion.  Neurological: She is alert and oriented to person, place, and time.  Skin: Skin is warm and dry.  Psychiatric: She has a normal mood and affect.    ED Course  Procedures (including critical care time) Labs Reviewed - No data to display No results found. 1. Otalgia, right   2. GERD (gastroesophageal reflux disease)     MDM  No otitis noted:pt treated for gerd  Teressa Lower, NP 10/23/12 1828

## 2012-10-23 NOTE — ED Notes (Signed)
Pt reports right ear pain that started 3 days ago.  She also reports epigastric pain after eating.

## 2012-10-23 NOTE — ED Provider Notes (Signed)
Medical screening examination/treatment/procedure(s) were performed by non-physician practitioner and as supervising physician I was immediately available for consultation/collaboration.   Rolan Bucco, MD 10/23/12 [id number]

## 2012-10-25 ENCOUNTER — Ambulatory Visit (HOSPITAL_COMMUNITY)
Admission: RE | Admit: 2012-10-25 | Discharge: 2012-10-25 | Disposition: A | Discharge status: Home / Self Care | Payer: Medicaid Other | Source: Ambulatory Visit | Attending: Obstetrics & Gynecology | Admitting: Obstetrics & Gynecology

## 2012-10-25 DIAGNOSIS — N949 Unspecified condition associated with female genital organs and menstrual cycle: Secondary | ICD-10-CM | POA: Insufficient documentation

## 2012-10-25 DIAGNOSIS — D259 Leiomyoma of uterus, unspecified: Secondary | ICD-10-CM | POA: Insufficient documentation

## 2012-10-25 DIAGNOSIS — D219 Benign neoplasm of connective and other soft tissue, unspecified: Secondary | ICD-10-CM

## 2012-10-27 ENCOUNTER — Telehealth: Payer: Self-pay | Admitting: *Deleted

## 2012-10-27 NOTE — Telephone Encounter (Signed)
Patient left a message requesting results

## 2012-10-30 NOTE — Telephone Encounter (Signed)
Called Gabriela Perez- she is requesting results of the sonohysterogram. Result has been reviewed by Dr. Debroah Loop- informed patient it shows fibroids and suggest mri may be helpful if doctor agrees. Informed her per Dr. Debroah Loop note plan was for her to have sonohysterogram and then return to review results and make plan of care- no appt. Made yet. Patient agrees with plan- informed her I would send a note to front office to call her to  Make an appointment to see Dr. Debroah Loop.

## 2012-11-05 ENCOUNTER — Other Ambulatory Visit: Payer: Self-pay | Admitting: Advanced Practice Midwife

## 2012-11-05 ENCOUNTER — Telehealth (HOSPITAL_COMMUNITY): Payer: Self-pay | Admitting: *Deleted

## 2012-11-05 MED ORDER — NYSTATIN-TRIAMCINOLONE 100000-0.1 UNIT/GM-% EX OINT: TOPICAL_OINTMENT | Freq: Two times a day (BID) | CUTANEOUS | Status: DC

## 2012-11-23 ENCOUNTER — Ambulatory Visit: Payer: Medicaid Other | Admitting: Obstetrics & Gynecology

## 2012-12-11 ENCOUNTER — Ambulatory Visit (INDEPENDENT_AMBULATORY_CARE_PROVIDER_SITE_OTHER): Payer: Medicaid Other | Admitting: Obstetrics & Gynecology

## 2012-12-11 DIAGNOSIS — N920 Excessive and frequent menstruation with regular cycle: Secondary | ICD-10-CM

## 2012-12-11 MED ORDER — NORGESTIMATE-ETH ESTRADIOL 0.25-35 MG-MCG PO TABS: 1.0000 | ORAL_TABLET | Freq: Every day | ORAL | Status: DC

## 2012-12-11 NOTE — Progress Notes (Signed)
Patient ID: Gabriela Perez, female   DOB: 03-21-1981, 32 y.o.   MRN: [id number] Patient with fibroids had SHG, comes for result. Heavy menses despite taking Provera daqily Current Outpatient Prescriptions on File Prior to Visit  Medication Sig Dispense Refill  . FeFum-FePoly-FA-B Cmp-C-Biot (INTEGRA PLUS) CAPS Take 1 capsule by mouth daily.  30 capsule  3  . hydrochlorothiazide (HYDRODIURIL) 25 MG tablet Take 1 tablet (25 mg total) by mouth daily.  30 tablet  2  . medroxyPROGESTERone (PROVERA) 10 MG tablet Take 2 tablets (20 mg total) by mouth daily.  30 tablet  2  . nystatin-triamcinolone ointment (MYCOLOG) Apply topically 2 (two) times daily.  30 g  0  . pantoprazole (PROTONIX) 20 MG tablet Take 1 tablet (20 mg total) by mouth daily.  30 tablet  0   No current facility-administered medications on file prior to visit.   Past Medical History  Diagnosis Date  . Anemia   . Hypertension    *RADIOLOGY REPORT*  Clinical Data: Pelvic pain, fibroids  [place] SONOHYSTEROGRAM  Technique: Following cleansing of the cervix and vagina with  Betadine, a hysterosalpingogram catheter was placed within the  endocervical canal. Sonohysterogram was then performed with  transvaginal sonography during infusion of sterile saline solution  into the endometrial cavity. No complication was observed.  Comparison: 08/09/2012  Findings: Uterus is anteverted and anteflexed. Multiple fibroids  are again noted. There is normal distention of the endometrial  canal by saline without focal filling defect identified.  Endometrial thickness measures 1.1 cm. C-section change is  identified with subendometrial cyst incidentally noted in region of  the C section.  IMPRESSION:  No endometrial canal filling defect identified. Fibroids again  noted, not specifically evaluated at today's examination. If  further evaluation is indicated for the purposes of treatment  planning, pelvic MRI with contrast could be helpful to  determine  whether the patient is a candidate for minimally invasive treatment  options.  Original Report Authenticated By: Christiana Pellant, M.D.  Imp: No submucous fibroid Menorrhagia  Plan OCP-Sprintec RTC 3 month  F/U blood pressure at PCP   Adam Phenix, MD 12/11/2012

## 2012-12-11 NOTE — Patient Instructions (Signed)
Oral Contraception Use  Oral contraceptives (OCs) are medicines taken to prevent pregnancy. OCs work by preventing the ovaries from releasing eggs. The hormones in OCs also cause the cervical mucus to thicken, preventing the sperm from entering the uterus. The hormones also cause the uterine lining to become thin, not allowing a fertilized egg to attach to the inside of the uterus. OCs are highly effective when taken exactly as prescribed. However, OCs do not prevent sexually transmitted diseases (STDs). Safe sex practices, such as using condoms along with an OC, can help prevent STDs.   Before taking OCs, you may have a physical exam and Pap test. Your caregiver may also order blood tests if necessary. Your caregiver will make sure you are a good candidate for oral contraception. Discuss with your caregiver the possible side effects of the OC you may be prescribed. When starting an OC, it can take 2 to 3 months for the body to adjust to the changes in hormone levels in your body.   HOW TO TAKE ORAL CONTRACEPTIVES  Your caregiver may advise you on how to start taking the first cycle of OCs. Otherwise, you can:  · Start on day 1 of your menstrual period. You will not need any backup contraceptive protection with this start time.  · Start on the first Sunday after your menstrual period or the day you get your prescription. In these cases, you will need to use backup contraceptive protection for the first 7-day cycle.  After you have started taking OCs:  · If you forget to take 1 pill, take it as soon as you remember. Take the next pill at the regular time.  · If you miss 2 or more pills, use backup birth control until your next menstrual period starts.  · If you use a 28-day pack that contains inactive pills and you miss 1 of the last 7 pills (pills with no hormones), it will not matter. Throw away the rest of the non-hormone pills and start a new pill pack.  No matter which day you start the OC, you will always start  a new pack on that same day of the week. Have an extra pack of OCs and a backup contraceptive method available in case you miss some pills or lose your OC pack.  HOME CARE INSTRUCTIONS   · Do not smoke.  · Always use a condom to protect against STDs. OCs do not protect against STDs.  · Use a calendar to mark your menstrual period days.  · Read the information and directions that come with your OC. Talk to your caregiver if you have questions.  SEEK MEDICAL CARE IF:   · You develop nausea and vomiting.  · You have abnormal vaginal discharge or bleeding.  · You develop a rash.  · You miss your menstrual period.  · You are losing your hair.  · You need treatment for mood swings or depression.  · You get dizzy when taking the OC.  · You develop acne from taking the OC.  · You become pregnant.  SEEK IMMEDIATE MEDICAL CARE IF:   · You develop chest pain.  · You develop shortness of breath.  · You have an uncontrolled or severe headache.  · You develop numbness or slurred speech.  · You develop visual problems.  · You develop pain, redness, and swelling in the legs.  Document Released: 04/08/2011 Document Revised: 07/12/2011 Document Reviewed: 04/08/2011  Exit[employer]® Patient Information ©2014 [employer].

## 2013-03-12 ENCOUNTER — Ambulatory Visit (INDEPENDENT_AMBULATORY_CARE_PROVIDER_SITE_OTHER): Payer: Medicaid Other | Admitting: Obstetrics & Gynecology

## 2013-03-12 ENCOUNTER — Encounter: Payer: Self-pay | Admitting: Obstetrics & Gynecology

## 2013-03-12 VITALS — BP 140/89 | HR 94 | Temp 98.4°F | Ht 68.0 in | Wt 306.6 lb

## 2013-03-12 DIAGNOSIS — D259 Leiomyoma of uterus, unspecified: Secondary | ICD-10-CM

## 2013-03-12 LAB — POCT URINALYSIS DIP (DEVICE)
Glucose, UA: NEGATIVE mg/dL
Hgb urine dipstick: NEGATIVE
Leukocytes, UA: NEGATIVE
Nitrite: NEGATIVE
Urobilinogen, UA: 0.2 mg/dL (ref 0.0–1.0)
pH: 5.5 (ref 5.0–8.0)

## 2013-03-12 MED ORDER — TRAMADOL-ACETAMINOPHEN 37.5-325 MG PO TABS: 1.0000 | ORAL_TABLET | Freq: Four times a day (QID) | ORAL | Status: DC | PRN

## 2013-03-12 NOTE — Patient Instructions (Signed)

## 2013-03-12 NOTE — Progress Notes (Signed)
  Subjective:    Patient ID: Gabriela Perez, female    DOB: 10/01/1980, 32 y.o.   MRN: [id number]  HPI Patient's last menstrual period was 02/26/2013. G1P1001 Despite OCP menses still heavy but regular and severe cramps. Last for 4 days.     Review of Systems  Constitutional: Negative for fever.  Genitourinary: Positive for menstrual problem and pelvic pain (cramps). Negative for vaginal discharge.  Skin: Positive for pallor.       Objective:   Physical Exam  Constitutional: She appears well-developed. No distress.  Abdominal: Soft. There is no tenderness.  Skin: Skin is warm and dry.  Psychiatric: She has a normal mood and affect. Her behavior is normal.          Assessment & Plan:  Menorrhagia, does nor need birth control, same sex relationship  RTC for biopsy preop for Novasure ablation, request submitted  Adam Phenix, MD 03/12/2013

## 2013-03-14 ENCOUNTER — Other Ambulatory Visit: Payer: Self-pay | Admitting: Obstetrics & Gynecology

## 2013-03-15 ENCOUNTER — Telehealth: Payer: Self-pay

## 2013-03-15 MED ORDER — DICLOFENAC SODIUM 75 MG PO TBEC: 75.0000 mg | DELAYED_RELEASE_TABLET | Freq: Two times a day (BID) | ORAL | Status: DC

## 2013-03-15 NOTE — Telephone Encounter (Signed)
Called pt and informed pt that the provider prescribed her diclofenac 75 mg q 12 hr PRN and it was sent to her [facility] pharmacy off [address]. Pt stated understanding.

## 2013-03-15 NOTE — Telephone Encounter (Addendum)
Pt called and stated that she saw Dr. Debroah Loop and was prescribed tramadol for severe pain during menses.  She stated that the tramadol does not work for her painful periods and she wants something else for pain.  Toradol and percocet worked.  Per Dr. Debroah Loop pt can have diclofenac 75 mg po q 12 hr PRN.

## 2013-04-02 ENCOUNTER — Telehealth: Payer: Self-pay | Admitting: *Deleted

## 2013-04-02 NOTE — Telephone Encounter (Signed)
Patient left a message that she would like to speak to Dr. Debroah Loop prior to her surgery.

## 2013-04-03 NOTE — Telephone Encounter (Signed)
Spoke with patient and she stated that she needs an appt to be seen before her surgery. I reviewed her chart and see that she needs an endo bx prior to surgery. Patient will come in on 04/11/13 at 1245.

## 2013-04-09 ENCOUNTER — Encounter (HOSPITAL_COMMUNITY): Payer: Self-pay | Admitting: Pharmacy Technician

## 2013-04-10 ENCOUNTER — Encounter (HOSPITAL_COMMUNITY): Payer: Self-pay

## 2013-04-10 ENCOUNTER — Encounter (HOSPITAL_COMMUNITY)
Admission: RE | Admit: 2013-04-10 | Discharge: 2013-04-10 | Disposition: A | Discharge status: Home / Self Care | Payer: Medicaid Other | Source: Ambulatory Visit | Attending: Obstetrics & Gynecology | Admitting: Obstetrics & Gynecology

## 2013-04-10 HISTORY — DX: Personal history of other medical treatment: Z92.89

## 2013-04-10 HISTORY — DX: Gastro-esophageal reflux disease without esophagitis: K21.9

## 2013-04-10 LAB — CBC
HCT: 28.3 % — ABNORMAL LOW (ref 36.0–46.0)
MCH: 20.1 pg — ABNORMAL LOW (ref 26.0–34.0)
MCHC: 29.7 g/dL — ABNORMAL LOW (ref 30.0–36.0)
MCV: 67.9 fL — ABNORMAL LOW (ref 78.0–100.0)
Platelets: 436 10*3/uL — ABNORMAL HIGH (ref 150–400)
RDW: 17.8 % — ABNORMAL HIGH (ref 11.5–15.5)
WBC: 4.4 10*3/uL (ref 4.0–10.5)

## 2013-04-10 LAB — BASIC METABOLIC PANEL
BUN: 13 mg/dL (ref 6–23)
Chloride: 99 mEq/L (ref 96–112)
Creatinine, Ser: 0.59 mg/dL (ref 0.50–1.10)
GFR calc Af Amer: >90 mL/min (ref >90)
GFR calc non Af Amer: >90 mL/min (ref >90)

## 2013-04-10 NOTE — Pre-Procedure Instructions (Signed)
SDS BB History log given to lab for previous transfusion - 2 units at [facility] in 08/2012

## 2013-04-10 NOTE — Patient Instructions (Addendum)
   Your procedure is scheduled on: Tuesday, Dec 16  Enter through the [employer] of [facility] at: 1230 pm Pick up the phone at the desk and dial [phone] and inform us of your arrival.  Please call this number if you have any problems the morning of surgery: [phone]  Remember: Do not eat food after midnight: Monday Do not drink clear liquids after: 10 AM Tuesday, day of surgery Take these medicines the morning of surgery with a SIP OF WATER: lisinopril-hydrochlorothiazide, protonix   Do not wear jewelry, make-up, or FINGER nail polish No metal in your hair or on your body. Do not wear lotions, powders, perfumes. You may wear deodorant.  Please use your CHG wash as directed prior to surgery.  Do not shave anywhere for at least 12 hours prior to first CHG shower.  Do not bring valuables to the hospital. Contacts, dentures or bridgework may not be worn into surgery.  Patients discharged on the day of surgery will not be allowed to drive home.   Home with friend Gabriela Perez or sister Gabriela Perez.

## 2013-04-11 ENCOUNTER — Other Ambulatory Visit (HOSPITAL_COMMUNITY)
Admission: RE | Admit: 2013-04-11 | Discharge: 2013-04-11 | Disposition: A | Discharge status: Home / Self Care | Payer: Medicaid Other | Source: Ambulatory Visit | Attending: Obstetrics & Gynecology | Admitting: Obstetrics & Gynecology

## 2013-04-11 ENCOUNTER — Ambulatory Visit (INDEPENDENT_AMBULATORY_CARE_PROVIDER_SITE_OTHER): Payer: Medicaid Other | Admitting: Obstetrics & Gynecology

## 2013-04-11 ENCOUNTER — Encounter: Payer: Self-pay | Admitting: *Deleted

## 2013-04-11 ENCOUNTER — Encounter: Payer: Self-pay | Admitting: Obstetrics & Gynecology

## 2013-04-11 VITALS — BP 129/80 | HR 83 | Temp 96.9°F | Ht 68.0 in | Wt 304.4 lb

## 2013-04-11 DIAGNOSIS — N920 Excessive and frequent menstruation with regular cycle: Secondary | ICD-10-CM | POA: Insufficient documentation

## 2013-04-11 LAB — POCT PREGNANCY, URINE: Preg Test, Ur: NEGATIVE

## 2013-04-11 NOTE — Progress Notes (Signed)
Patient ID: Gabriela Perez, female   DOB: [birth year], 32 y.o.   MRN: [id number] No longer states that she will be in same sex relationship long term, may need BCM. Will cancel ablation for now , consider Mirena or BTL/ablation Patient given informed consent, signed copy in the chart, time out was performed. Appropriate time out taken. . The patient was placed in the lithotomy position and the cervix brought into view with sterile speculum.  Portio of cervix cleansed x 2 with betadine swabs.  A tenaculum was placed in the anterior lip of the cervix.  The uterus was sounded for depth of 7 cm,. A pipelle was introduced to into the uterus, suction created,  and an endometrial sample was obtained. All equipment was removed and accounted for.  The patient tolerated the procedure well.    Patient given post procedure instructions. The patient will return in 2 weeks for results.  Adam Phenix, MD 04/11/2013

## 2013-04-11 NOTE — Patient Instructions (Signed)

## 2013-04-17 ENCOUNTER — Ambulatory Visit (HOSPITAL_COMMUNITY)
Admission: RE | Admit: 2013-04-17 | Payer: Medicaid Other | Source: Ambulatory Visit | Admitting: Obstetrics & Gynecology

## 2013-04-17 ENCOUNTER — Encounter (HOSPITAL_COMMUNITY): Admission: RE | Payer: Self-pay | Source: Ambulatory Visit

## 2013-04-17 SURGERY — NOVASURE ABLATION
Anesthesia: Choice | Site: Vagina

## 2013-04-18 ENCOUNTER — Encounter (HOSPITAL_BASED_OUTPATIENT_CLINIC_OR_DEPARTMENT_OTHER): Payer: Self-pay | Admitting: Emergency Medicine

## 2013-04-18 ENCOUNTER — Emergency Department (HOSPITAL_BASED_OUTPATIENT_CLINIC_OR_DEPARTMENT_OTHER)
Admission: EM | Admit: 2013-04-18 | Discharge: 2013-04-18 | Disposition: A | Discharge status: Home / Self Care | Payer: Medicaid Other | Attending: Emergency Medicine | Admitting: Emergency Medicine

## 2013-04-18 DIAGNOSIS — J029 Acute pharyngitis, unspecified: Secondary | ICD-10-CM | POA: Insufficient documentation

## 2013-04-18 DIAGNOSIS — R0789 Other chest pain: Secondary | ICD-10-CM | POA: Insufficient documentation

## 2013-04-18 DIAGNOSIS — I10 Essential (primary) hypertension: Secondary | ICD-10-CM | POA: Insufficient documentation

## 2013-04-18 DIAGNOSIS — D649 Anemia, unspecified: Secondary | ICD-10-CM | POA: Insufficient documentation

## 2013-04-18 DIAGNOSIS — J3489 Other specified disorders of nose and nasal sinuses: Secondary | ICD-10-CM

## 2013-04-18 DIAGNOSIS — Z91040 Latex allergy status: Secondary | ICD-10-CM | POA: Insufficient documentation

## 2013-04-18 DIAGNOSIS — Z79899 Other long term (current) drug therapy: Secondary | ICD-10-CM | POA: Insufficient documentation

## 2013-04-18 DIAGNOSIS — J209 Acute bronchitis, unspecified: Secondary | ICD-10-CM | POA: Insufficient documentation

## 2013-04-18 DIAGNOSIS — R197 Diarrhea, unspecified: Secondary | ICD-10-CM | POA: Insufficient documentation

## 2013-04-18 DIAGNOSIS — K219 Gastro-esophageal reflux disease without esophagitis: Secondary | ICD-10-CM | POA: Insufficient documentation

## 2013-04-18 DIAGNOSIS — IMO0001 Reserved for inherently not codable concepts without codable children: Secondary | ICD-10-CM | POA: Insufficient documentation

## 2013-04-18 DIAGNOSIS — J40 Bronchitis, not specified as acute or chronic: Secondary | ICD-10-CM

## 2013-04-18 MED ORDER — AZITHROMYCIN 250 MG PO TABS: 250.0000 mg | ORAL_TABLET | Freq: Every day | ORAL | Status: DC

## 2013-04-18 MED ORDER — AZITHROMYCIN 250 MG PO TABS
500.0000 mg | ORAL_TABLET | Freq: Once | ORAL | Status: AC
  Administered 2013-04-18: 500 mg via ORAL
  Filled 2013-04-18: qty 2

## 2013-04-18 MED ORDER — HYDROCODONE-ACETAMINOPHEN 7.5-325 MG/15ML PO SOLN: 10.0000 mL | Freq: Four times a day (QID) | ORAL | Status: DC | PRN

## 2013-04-18 NOTE — ED Provider Notes (Signed)
Medical screening examination/treatment/procedure(s) were performed by non-physician practitioner and as supervising physician I was immediately available for consultation/collaboration.     Juda Toepfer, MD 04/18/13 2313 

## 2013-04-18 NOTE — ED Notes (Signed)
C/o congested cough x two weeks, onset of abdominal pain/diarrhea last night.  Denies fever, vomiting, dysuria.

## 2013-04-18 NOTE — ED Provider Notes (Signed)
CSN: [id number]     Arrival date & time 04/18/13  1744 History   First MD Initiated Contact with Patient 04/18/13 1832     Chief Complaint  Patient presents with  . Abdominal Pain  . Diarrhea  . Cough   (Consider location/radiation/quality/duration/timing/severity/associated sxs/prior Treatment) Patient is a 32 y.o. female presenting with diarrhea and cough. The history is provided by the patient. No language interpreter was used.  Diarrhea Associated symptoms: abdominal pain, fever and myalgias   Associated symptoms: no vomiting   Cough Cough characteristics:  Productive Associated symptoms: fever, myalgias and sore throat   Associated symptoms: no rash   Associated symptoms comment:  She reports URI symptoms of sinus pressure, cough for the past 2 weeks. In the last 3 days, she has had fever, body aches, worsening cough with chest tightness with cough, sore throat.    Past Medical History  Diagnosis Date  . Anemia   . Hypertension   . GERD (gastroesophageal reflux disease)   . History of blood transfusion 08/2012    [facility]  2 units transfused   Past Surgical History  Procedure Laterality Date  . Cesarean section  2003   Family History  Problem Relation Age of Onset  . Hypertension Mother   . Diabetes Mother   . Hypertension Father    History  Substance Use Topics  . Smoking status: Never Smoker   . Smokeless tobacco: Never Used  . Alcohol Use: No   OB History   Grav Para Term Preterm Abortions TAB SAB Ect Mult Living   1 1 1  0 0 0 0 0 0 1     Review of Systems  Constitutional: Positive for fever.  HENT: Positive for congestion, sinus pressure and sore throat.   Respiratory: Positive for cough and chest tightness.   Gastrointestinal: Positive for abdominal pain and diarrhea. Negative for vomiting and blood in stool.  Genitourinary: Negative for dysuria.  Musculoskeletal: Positive for myalgias.  Skin: Negative for rash.    Allergies  Latex  Home Medications    Current Outpatient Rx  Name  Route  Sig  Dispense  Refill  . FeFum-FePoly-FA-B Cmp-C-Biot (INTEGRA PLUS) CAPS   Oral   Take 1 capsule by mouth daily.   30 capsule   3   . lisinopril-hydrochlorothiazide (PRINZIDE,ZESTORETIC) 20-25 MG per tablet   Oral   Take 1 tablet by mouth daily.         . pantoprazole (PROTONIX) 40 MG tablet   Oral   Take 40 mg by mouth daily.          BP 123/72  Pulse 74  Temp(Src) 98.2 F (36.8 C) (Oral)  Resp 16  Ht 5\' 8"  (1.727 m)  Wt 304 lb (137.893 kg)  BMI 46.23 kg/m2  SpO2 100%  LMP 04/09/2013 Physical Exam  Constitutional: She is oriented to person, place, and time. She appears well-developed and well-nourished.  HENT:  Head: Normocephalic.  Right Ear: External ear normal.  Left Ear: External ear normal.  Nose: Mucosal edema present. Right sinus exhibits frontal sinus tenderness. Left sinus exhibits frontal sinus tenderness.  Mouth/Throat: Oropharynx is clear and moist.  Neck: Normal range of motion. Neck supple.  Cardiovascular: Normal rate and normal heart sounds.   No murmur heard. Pulmonary/Chest: Effort normal and breath sounds normal. She has no wheezes. She has no rales.  Abdominal: Soft. Bowel sounds are normal. She exhibits no distension. There is no tenderness.  Musculoskeletal: Normal range of motion. She exhibits no  edema.  Lymphadenopathy:    She has no cervical adenopathy.  Neurological: She is alert and oriented to person, place, and time.  Skin: Skin is warm and dry. No pallor.  Psychiatric: She has a normal mood and affect.    ED Course  Procedures (including critical care time) Labs Review Labs Reviewed - No data to display Imaging Review No results found.  EKG Interpretation   None       MDM  No diagnosis found. 1. Bronchitis 2. Sinus pressure  Will treat with Z-pack given duration of symptoms, with reported fever at home. Other supportive care. Stable for discharge.     Arnoldo Hooker,  PA-C 04/18/13 1944

## 2013-05-07 ENCOUNTER — Telehealth: Payer: Self-pay | Admitting: General Practice

## 2013-05-07 NOTE — Telephone Encounter (Signed)
Patient called and left message stating she is calling about her pain Rx previously written by Dr Roselie Awkward and that he recently gave her a Rx for diclofenac but it doesn't help her pain at all and she is in an extreme amount of pain. Called patient stating I was returning her phone call and that Dr Roselie Awkward is not here in the hospital today but will be in the hospital on Wednesday and I will speak with him then about her pain and that she also needs a follow up appt with [place] and asked the patient to come in for follow up 1/21 @ 230. Patient stated that she would and is awaiting our phone call after speaking with Dr Roselie Awkward about her pain. Patient had no further questions.

## 2013-05-09 ENCOUNTER — Other Ambulatory Visit: Payer: Self-pay | Admitting: Obstetrics & Gynecology

## 2013-05-09 DIAGNOSIS — D259 Leiomyoma of uterus, unspecified: Secondary | ICD-10-CM

## 2013-05-09 MED ORDER — HYDROCODONE-ACETAMINOPHEN 5-300 MG PO TABS: 1.0000 | ORAL_TABLET | Freq: Four times a day (QID) | ORAL | Status: DC | PRN

## 2013-05-09 NOTE — Telephone Encounter (Signed)
Received hyrdocodone Rx from dr Roselie Awkward. Called patient, no answer- left message stating i am calling to let her know we have a prescription here at the office for her to come by and pickup and to please give [place] a call back at the clinics should she have any questions or concerns

## 2013-05-23 ENCOUNTER — Ambulatory Visit (INDEPENDENT_AMBULATORY_CARE_PROVIDER_SITE_OTHER): Payer: Medicaid Other | Admitting: Obstetrics & Gynecology

## 2013-05-23 ENCOUNTER — Encounter: Payer: Self-pay | Admitting: Obstetrics & Gynecology

## 2013-05-23 ENCOUNTER — Encounter: Payer: Self-pay | Admitting: *Deleted

## 2013-05-23 VITALS — BP 119/74 | HR 77 | Temp 99.4°F | Ht 68.0 in | Wt 305.4 lb

## 2013-05-23 DIAGNOSIS — N920 Excessive and frequent menstruation with regular cycle: Secondary | ICD-10-CM

## 2013-05-23 DIAGNOSIS — D259 Leiomyoma of uterus, unspecified: Secondary | ICD-10-CM

## 2013-05-23 NOTE — Progress Notes (Signed)
Patient ID: Gabriela Perez, female   DOB: 10/05/1980, 33 y.o.   MRN: [id number] Subjective:   Patient ID: Gabriela Perez, female DOB: 07-May-1980, 33 y.o. MRN: [id number]  HPI Patient's last menstrual period was 05/07/2013. G1P1001  Despite OCP menses still heavy but regular and severe cramps. Last for 4 days.  Review of Systems  Constitutional: Negative for fever.  Genitourinary: Positive for menstrual problem and pelvic pain (cramps). Negative for vaginal discharge.  Skin: Positive for pallor.  Objective:   Physical Exam  Constitutional: She appears well-developed. No distress.   Skin: Skin is warm and dry.  Psychiatric: She has a normal mood and affect. Her behavior is normal.  Assessment & Plan:   Menorrhagia, wants sterilization and ablation. The procedure and the risk of anesthesia, bleeding, infection, bowel and bladder injury, failure (1/200) and ectopic pregnancy were discussed and her questions were answered. The procedure will be scheduled as an outpatient. Novasure procedure and risks explained. Pt declines Mirena. RTC for biopsy preop for Novasure ablation and LBTL, request submitted  Woodroe Mode, MD  03/12/2013

## 2013-05-23 NOTE — Patient Instructions (Signed)
Endometrial Ablation Endometrial ablation removes the lining of the uterus (endometrium). It is usually a same-day, outpatient treatment. Ablation helps avoid major surgery, such as surgery to remove the cervix and uterus (hysterectomy). After endometrial ablation, you will have little or no menstrual bleeding and may not be able to have children. However, if you are premenopausal, you will need to use a reliable method of birth control following the procedure because of the small chance that pregnancy can occur. There are different reasons to have this procedure, which include:  Heavy periods.  Bleeding that is causing anemia.  Irregular bleeding.  Bleeding fibroids on the lining inside the uterus if they are smaller than 3 centimeters. This procedure should not be done if:  You want children in the future.  You have severe cramps with your menstrual period.  You have precancerous or cancerous cells in your uterus.  You were recently pregnant.  You have gone through menopause.  You have had major surgery on the uterus, such as a cesarean delivery. LET [facility] CARE PROVIDER KNOW ABOUT:  Any allergies you have.  All medicines you are taking, including vitamins, herbs, eye drops, creams, and over-the-counter medicines.  Previous problems you or members of your family have had with the use of anesthetics.  Any blood disorders you have.  Previous surgeries you have had.  Medical conditions you have. RISKS AND COMPLICATIONS  Generally, this is a safe procedure. However, as with any procedure, complications can occur. Possible complications include:  Perforation of the uterus.  Bleeding.  Infection of the uterus, bladder, or vagina.  Injury to surrounding organs.  An air bubble to the lung (air embolus).  Pregnancy following the procedure.  Failure of the procedure to help the problem, requiring hysterectomy.  Decreased ability to diagnose cancer in the lining of  the uterus. BEFORE THE PROCEDURE  The lining of the uterus must be tested to make sure there is no pre-cancerous or cancer cells present.  An ultrasound may be performed to look at the size of the uterus and to check for abnormalities.  Medicines may be given to thin the lining of the uterus. PROCEDURE  During the procedure, your health care provider will use a tool called a resectoscope to help see inside your uterus. There are different ways to remove the lining of your uterus.   Radiofrequency  This method uses a radiofrequency-alternating electric current to remove the lining of the uterus.  Cryotherapy This method uses extreme cold to freeze the lining of the uterus.  Heated-Free Liquid  This method uses heated salt (saline) solution to remove the lining of the uterus.  Microwave This method uses high-energy microwaves to heat up the lining of the uterus to remove it.  Thermal balloon  This method involves inserting a catheter with a balloon tip into the uterus. The balloon tip is filled with heated fluid to remove the lining of the uterus. AFTER THE PROCEDURE  After your procedure, do not have sexual intercourse or insert anything into your vagina until permitted by your health care provider. After the procedure, you may experience:  Cramps.  Vaginal discharge.  Frequent urination. Document Released: 02/27/2004 Document Revised: 12/20/2012 Document Reviewed: 09/20/2012 [facility] Patient Information 2014 [place]. Laparoscopic Tubal Ligation Laparoscopic tubal ligation is a procedure that closes the fallopian tubes at a time other than right after childbirth. By closing the fallopian tubes, the eggs that are released from the ovaries cannot enter the uterus and sperm cannot reach the  egg. Tubal ligation is also known as getting your "tubes tied." Tubal ligation is done so you will not be able to get pregnant or have a baby.  Although this procedure may be reversed, it  should be considered permanent and irreversible. If you want to have future pregnancies, you should not have this procedure.  LET YOUR CAREGIVER KNOW ABOUT:  Allergies to food or medicine.  Medicines taken, including vitamins, herbs, eyedrops, over-the-counter medicines, and creams.  Use of steroids (by mouth or creams).  Previous problems with numbing medicines.  History of bleeding problems or blood clots.  Any recent colds or infections.  Previous surgery.  Other health problems, including diabetes and kidney problems.  Possibility of pregnancy, if this applies.  Any past pregnancies. RISKS AND COMPLICATIONS   Infection.  Bleeding.  Injury to surrounding organs.  Anesthetic side effects.  Failure of the procedure.  Ectopic pregnancy.  Future regret about having the procedure done. BEFORE THE PROCEDURE  Do not take aspirin or blood thinners a week before the procedure or as directed. This can cause bleeding.  Do not eat or drink anything 6 to 8 hours before the procedure. PROCEDURE   You may be given a medicine to help you relax (sedative) before the procedure. You will be given a medicine to make you sleep (general anesthetic) during the procedure.  A tube will be put down your throat to help your breath while under general anesthesia.  Two small cuts (incisions) are made in the lower abdominal area and near the belly button.  Your abdominal area will be inflated with a safe gas (carbon dioxide). This helps give the surgeon room to operate, visualize, and helps the surgeon avoid other organs.  A thin, lighted tube (laparoscope) with a camera attached is inserted into your abdomen through one of the incisions near the belly button. Other small instruments are also inserted through the other abdominal incision.  The fallopian tubes are located and are either blocked with a ring, clip, or are burned (cauterized).  After the fallopian tubes are blocked, the gas  is released from the abdomen.  The incisions will be closed with stitches (sutures), and a bandage may be placed over the incisions. AFTER THE PROCEDURE   You will rest in a recovery room for 1 4 hours until you are stable and doing well.  You will also have some mild abdominal discomfort for 3 7 days. You will be given pain medicine to ease any discomfort.  As long as there are no problems, you may be allowed to go home. Someone will need to drive you home and be with you for at least 24 hours once home.  You may have some mild discomfort in the throat. This is from the tube placed in your throat while you were sleeping.  You may experience discomfort in the shoulder area from some trapped air between the liver and diaphragm. This sensation is normal and will slowly go away on its own. Document Released: 07/26/2000 Document Revised: 10/19/2011 Document Reviewed: 07/31/2011 [facility] Patient Information 2014 [place].

## 2013-05-29 ENCOUNTER — Other Ambulatory Visit: Payer: Self-pay | Admitting: Obstetrics & Gynecology

## 2013-06-04 ENCOUNTER — Encounter (HOSPITAL_COMMUNITY): Payer: Self-pay | Admitting: Emergency Medicine

## 2013-06-04 ENCOUNTER — Emergency Department (INDEPENDENT_AMBULATORY_CARE_PROVIDER_SITE_OTHER): Payer: Medicaid Other

## 2013-06-04 ENCOUNTER — Emergency Department (HOSPITAL_COMMUNITY)
Admission: EM | Admit: 2013-06-04 | Discharge: 2013-06-04 | Disposition: A | Discharge status: Home / Self Care | Payer: Medicaid Other | Source: Home / Self Care | Attending: Family Medicine | Admitting: Family Medicine

## 2013-06-04 DIAGNOSIS — R059 Cough, unspecified: Secondary | ICD-10-CM

## 2013-06-04 DIAGNOSIS — R05 Cough: Secondary | ICD-10-CM

## 2013-06-04 DIAGNOSIS — R053 Chronic cough: Secondary | ICD-10-CM

## 2013-06-04 MED ORDER — LOSARTAN POTASSIUM-HCTZ 100-25 MG PO TABS: 1.0000 | ORAL_TABLET | Freq: Every day | ORAL | Status: DC

## 2013-06-04 NOTE — Discharge Instructions (Signed)
Thank you for coming in today. Stop lisinopril Start losartan/hydrochlorothiazide Continue pantoprazole Followup with your primary care provider Call or go to the emergency room if you get worse, have trouble breathing, have chest pains, or palpitations.

## 2013-06-04 NOTE — ED Provider Notes (Signed)
Gabriela Perez is a 33 y.o. female who presents to Urgent Care today for one month of not being nonproductive cough. Patient has been on ACE inhibitor now for about 6 months. She denies any shortness of breath chest pains or palpitations. She denies any trouble breathing or reflux symptoms. She's currently taking Protonix which seems to help her previous GERD. She feels well otherwise.   Past Medical History  Diagnosis Date  . Anemia   . Hypertension   . GERD (gastroesophageal reflux disease)   . History of blood transfusion 08/2012    [place]  2 units transfused   History  Substance Use Topics  . Smoking status: Never Smoker   . Smokeless tobacco: Never Used  . Alcohol Use: No   ROS as above Medications: No current facility-administered medications for this encounter.   Current Outpatient Prescriptions  Medication Sig Dispense Refill  . [DISCONTINUED] lisinopril-hydrochlorothiazide (PRINZIDE,ZESTORETIC) 20-25 MG per tablet Take 1 tablet by mouth daily.      Marland Kitchen FeFum-FePoly-FA-B Cmp-C-Biot (INTEGRA PLUS) CAPS Take 1 capsule by mouth daily.  30 capsule  3  . Hydrocodone-Acetaminophen 5-300 MG TABS Take 1-2 tablets by mouth every 6 (six) hours as needed (pain).  20 each  0  . losartan-hydrochlorothiazide (HYZAAR) 100-25 MG per tablet Take 1 tablet by mouth daily. Patient is ACEi intolerant  30 tablet  1  . pantoprazole (PROTONIX) 40 MG tablet Take 40 mg by mouth daily.        Exam:  BP 138/85  Pulse 65  Temp(Src) 98.1 F (36.7 C) (Oral)  Resp 20  SpO2 100%  LMP 05/25/2013 Gen: Well NAD HEENT: EOMI,  MMM Lungs: Normal work of breathing. CTABL Heart: RRR no MRG Abd: NABS, Soft. NT, ND Exts: Brisk capillary refill, warm and well perfused.   No results found for this or any previous visit (from the past 24 hour(s)). Dg Chest 2 View  06/04/2013   CLINICAL DATA:  Chronic cough  EXAM: CHEST  2 VIEW  COMPARISON:  April 07, 2010  FINDINGS: Lungs are clear. Heart is upper normal in  size with nor pole pulmonary vascularity. No pneumothorax. No adenopathy. No bone lesions.  IMPRESSION: No edema or consolidation.   Electronically Signed   By: Lowella Grip M.D.   On: 06/04/2013 11:45    Assessment and Plan: 33 y.o. female with probable ACE inhibitor induced cough. Plan to transition from lisinopril/hydrochlorothiazide to losartan/hydrochlorothiazide at a similar dose. Medicaid prior authorization form filled out. Followup with primary care provider  Discussed warning signs or symptoms. Please see discharge instructions. Patient expresses understanding.    Gregor Hams, MD 06/04/13 1230

## 2013-06-04 NOTE — ED Notes (Signed)
C/o  Persistent nonproductive cough x 1 month.  Denies fever, n/v/d.  No relief with otc meds.   States seen in ER for same reason and given cough med with hydrocodone, with no relief.

## 2013-06-04 NOTE — ED Notes (Signed)
Pt denies any chest pain or sob.  Mw,cma

## 2013-06-12 ENCOUNTER — Encounter (HOSPITAL_COMMUNITY)
Admission: RE | Admit: 2013-06-12 | Discharge: 2013-06-12 | Disposition: A | Discharge status: Home / Self Care | Payer: Medicaid Other | Source: Ambulatory Visit | Attending: Obstetrics & Gynecology | Admitting: Obstetrics & Gynecology

## 2013-06-12 ENCOUNTER — Encounter (HOSPITAL_COMMUNITY): Payer: Self-pay

## 2013-06-12 DIAGNOSIS — Z01812 Encounter for preprocedural laboratory examination: Secondary | ICD-10-CM | POA: Insufficient documentation

## 2013-06-12 LAB — CBC
HCT: 27 % — ABNORMAL LOW (ref 36.0–46.0)
Hemoglobin: 8 g/dL — ABNORMAL LOW (ref 12.0–15.0)
MCH: 19.5 pg — ABNORMAL LOW (ref 26.0–34.0)
MCHC: 29.6 g/dL — ABNORMAL LOW (ref 30.0–36.0)
MCV: 65.9 fL — AB (ref 78.0–100.0)
PLATELETS: 564 10*3/uL — AB (ref 150–400)
RBC: 4.1 MIL/uL (ref 3.87–5.11)
RDW: 17.9 % — ABNORMAL HIGH (ref 11.5–15.5)
WBC: 6.4 10*3/uL (ref 4.0–10.5)

## 2013-06-12 LAB — BASIC METABOLIC PANEL
BUN: 7 mg/dL (ref 6–23)
CHLORIDE: 99 meq/L (ref 96–112)
CO2: 26 meq/L (ref 19–32)
Calcium: 9.4 mg/dL (ref 8.4–10.5)
Creatinine, Ser: 0.58 mg/dL (ref 0.50–1.10)
GFR calc Af Amer: >90 mL/min (ref >90)
GFR calc non Af Amer: >90 mL/min (ref >90)
Glucose, Bld: 105 mg/dL — ABNORMAL HIGH (ref 70–99)
Potassium: 3.4 mEq/L — ABNORMAL LOW (ref 3.7–5.3)
SODIUM: 138 meq/L (ref 137–147)

## 2013-06-12 NOTE — Patient Instructions (Signed)
[place]  06/12/2013   Your procedure is scheduled on:  06/19/13  Enter through the Main Entrance of [facility] [place] up the phone at the desk and dial [phone].   Call this number if you have problems the morning of surgery: [phone]   Remember:   Do not eat food:After Midnight.  Do not drink clear liquids: 4 Hours before arrival.  Take these medicines the morning of surgery with A SIP OF WATER: Blood pressure medication and Protonix   Do not wear jewelry, make-up or nail polish.  Do not wear lotions, powders, or perfumes. You may wear deodorant.  Do not shave 48 hours prior to surgery.  Do not bring valuables to the hospital.  [facility] is not   responsible for any belongings or valuables brought to the hospital.  Contacts, dentures or bridgework may not be worn into surgery.  Leave suitcase in the car. After surgery it may be brought to your room.  For patients admitted to the hospital, checkout time is 11:00 AM the day of              discharge.   Patients discharged the day of surgery will not be allowed to drive             home.  Name and phone number of your driver: friend  Special Instructions:      Please read over the following fact sheets that you were given:   Surgical Site Infection Prevention

## 2013-06-13 NOTE — Pre-Procedure Instructions (Signed)
Return call from [place] office; she states Dr Roselie Awkward aware of lab results. No orders given.

## 2013-06-13 NOTE — Pre-Procedure Instructions (Signed)
Hgb of 8 called to Dr Tresa Moore, she will notify him.

## 2013-06-19 ENCOUNTER — Encounter (HOSPITAL_COMMUNITY): Payer: Medicaid Other | Admitting: Anesthesiology

## 2013-06-19 ENCOUNTER — Ambulatory Visit (HOSPITAL_COMMUNITY)
Admission: RE | Admit: 2013-06-19 | Discharge: 2013-06-19 | Disposition: A | Discharge status: Home / Self Care | Payer: Medicaid Other | Source: Ambulatory Visit | Attending: Obstetrics & Gynecology | Admitting: Obstetrics & Gynecology

## 2013-06-19 ENCOUNTER — Ambulatory Visit (HOSPITAL_COMMUNITY): Payer: Medicaid Other | Admitting: Anesthesiology

## 2013-06-19 ENCOUNTER — Encounter (HOSPITAL_COMMUNITY)
Admission: RE | Disposition: A | Discharge status: Home / Self Care | Payer: Self-pay | Source: Ambulatory Visit | Attending: Obstetrics & Gynecology

## 2013-06-19 ENCOUNTER — Encounter (HOSPITAL_COMMUNITY): Payer: Self-pay | Admitting: Anesthesiology

## 2013-06-19 DIAGNOSIS — Z302 Encounter for sterilization: Secondary | ICD-10-CM | POA: Insufficient documentation

## 2013-06-19 DIAGNOSIS — D649 Anemia, unspecified: Secondary | ICD-10-CM | POA: Insufficient documentation

## 2013-06-19 DIAGNOSIS — N838 Other noninflammatory disorders of ovary, fallopian tube and broad ligament: Secondary | ICD-10-CM | POA: Insufficient documentation

## 2013-06-19 DIAGNOSIS — K219 Gastro-esophageal reflux disease without esophagitis: Secondary | ICD-10-CM | POA: Insufficient documentation

## 2013-06-19 DIAGNOSIS — I10 Essential (primary) hypertension: Secondary | ICD-10-CM | POA: Insufficient documentation

## 2013-06-19 DIAGNOSIS — N920 Excessive and frequent menstruation with regular cycle: Secondary | ICD-10-CM

## 2013-06-19 DIAGNOSIS — D500 Iron deficiency anemia secondary to blood loss (chronic): Secondary | ICD-10-CM

## 2013-06-19 HISTORY — PX: LAPAROSCOPIC TUBAL LIGATION: SHX1937

## 2013-06-19 HISTORY — PX: NOVASURE ABLATION: SHX5394

## 2013-06-19 LAB — PREGNANCY, URINE: Preg Test, Ur: NEGATIVE

## 2013-06-19 SURGERY — NOVASURE ABLATION
Anesthesia: General | Site: Vagina

## 2013-06-19 MED ORDER — MEPERIDINE HCL 25 MG/ML IJ SOLN: 6.2500 mg | INTRAMUSCULAR | Status: DC | PRN

## 2013-06-19 MED ORDER — NEOSTIGMINE METHYLSULFATE 1 MG/ML IJ SOLN
INTRAMUSCULAR | Status: AC
  Filled 2013-06-19: qty 1

## 2013-06-19 MED ORDER — DEXAMETHASONE SODIUM PHOSPHATE 10 MG/ML IJ SOLN
INTRAMUSCULAR | Status: AC
  Filled 2013-06-19: qty 1

## 2013-06-19 MED ORDER — HYDROMORPHONE HCL PF 1 MG/ML IJ SOLN
INTRAMUSCULAR | Status: AC
  Filled 2013-06-19: qty 1

## 2013-06-19 MED ORDER — PROPOFOL 10 MG/ML IV BOLUS
INTRAVENOUS | Status: DC | PRN
Start: 2013-06-19 — End: 2013-06-19
  Administered 2013-06-19: 180 mg via INTRAVENOUS

## 2013-06-19 MED ORDER — LIDOCAINE HCL (CARDIAC) 20 MG/ML IV SOLN
INTRAVENOUS | Status: AC
  Filled 2013-06-19: qty 5

## 2013-06-19 MED ORDER — KETOROLAC TROMETHAMINE 30 MG/ML IJ SOLN
INTRAMUSCULAR | Status: DC | PRN
Start: 2013-06-19 — End: 2013-06-19
  Administered 2013-06-19: 30 mg via INTRAVENOUS

## 2013-06-19 MED ORDER — BUPIVACAINE-EPINEPHRINE (PF) 0.5% -1:200000 IJ SOLN
INTRAMUSCULAR | Status: AC
  Filled 2013-06-19: qty 10

## 2013-06-19 MED ORDER — EPHEDRINE 5 MG/ML INJ
INTRAVENOUS | Status: AC
  Filled 2013-06-19: qty 10

## 2013-06-19 MED ORDER — GLYCOPYRROLATE 0.2 MG/ML IJ SOLN
INTRAMUSCULAR | Status: AC
  Filled 2013-06-19: qty 3

## 2013-06-19 MED ORDER — EPHEDRINE SULFATE 50 MG/ML IJ SOLN
INTRAMUSCULAR | Status: DC | PRN
  Administered 2013-06-19: 5 mg via INTRAVENOUS
  Administered 2013-06-19: 10 mg via INTRAVENOUS

## 2013-06-19 MED ORDER — PHENYLEPHRINE 40 MCG/ML (10ML) SYRINGE FOR IV PUSH (FOR BLOOD PRESSURE SUPPORT)
PREFILLED_SYRINGE | INTRAVENOUS | Status: AC
  Filled 2013-06-19: qty 5

## 2013-06-19 MED ORDER — ROCURONIUM BROMIDE 100 MG/10ML IV SOLN
INTRAVENOUS | Status: AC
  Filled 2013-06-19: qty 1

## 2013-06-19 MED ORDER — LIDOCAINE HCL (CARDIAC) 20 MG/ML IV SOLN
INTRAVENOUS | Status: DC | PRN
  Administered 2013-06-19: 30 mg via INTRAVENOUS
  Administered 2013-06-19: 70 mg via INTRAVENOUS

## 2013-06-19 MED ORDER — GLYCOPYRROLATE 0.2 MG/ML IJ SOLN
INTRAMUSCULAR | Status: DC | PRN
  Administered 2013-06-19: .5 mg via INTRAVENOUS

## 2013-06-19 MED ORDER — HYDROCODONE-ACETAMINOPHEN 5-300 MG PO TABS: 1.0000 | ORAL_TABLET | Freq: Four times a day (QID) | ORAL | Status: DC | PRN

## 2013-06-19 MED ORDER — FENTANYL CITRATE 0.05 MG/ML IJ SOLN
INTRAMUSCULAR | Status: AC
  Filled 2013-06-19: qty 2

## 2013-06-19 MED ORDER — PROPOFOL 10 MG/ML IV EMUL
INTRAVENOUS | Status: AC
  Filled 2013-06-19: qty 20

## 2013-06-19 MED ORDER — PHENYLEPHRINE HCL 10 MG/ML IJ SOLN
INTRAMUSCULAR | Status: DC | PRN
  Administered 2013-06-19 (×2): 40 ug via INTRAVENOUS
  Administered 2013-06-19: 80 ug via INTRAVENOUS
  Administered 2013-06-19: 40 ug via INTRAVENOUS

## 2013-06-19 MED ORDER — LACTATED RINGERS IV SOLN
INTRAVENOUS | Status: DC
  Administered 2013-06-19: 13:00:00 via INTRAVENOUS

## 2013-06-19 MED ORDER — MIDAZOLAM HCL 2 MG/2ML IJ SOLN
INTRAMUSCULAR | Status: AC
  Filled 2013-06-19: qty 2

## 2013-06-19 MED ORDER — SODIUM CHLORIDE 0.9 % IJ SOLN
INTRAMUSCULAR | Status: AC
  Filled 2013-06-19: qty 10

## 2013-06-19 MED ORDER — ONDANSETRON HCL 4 MG/2ML IJ SOLN
INTRAMUSCULAR | Status: DC | PRN
  Administered 2013-06-19: 4 mg via INTRAVENOUS

## 2013-06-19 MED ORDER — FENTANYL CITRATE 0.05 MG/ML IJ SOLN
INTRAMUSCULAR | Status: DC | PRN
  Administered 2013-06-19 (×5): 50 ug via INTRAVENOUS

## 2013-06-19 MED ORDER — NEOSTIGMINE METHYLSULFATE 1 MG/ML IJ SOLN
INTRAMUSCULAR | Status: DC | PRN
  Administered 2013-06-19: 3 mg via INTRAVENOUS

## 2013-06-19 MED ORDER — HYDROCODONE-ACETAMINOPHEN 5-325 MG PO TABS
1.0000 | ORAL_TABLET | Freq: Once | ORAL | Status: AC
  Administered 2013-06-19: 1 via ORAL

## 2013-06-19 MED ORDER — FENTANYL CITRATE 0.05 MG/ML IJ SOLN
INTRAMUSCULAR | Status: AC
  Filled 2013-06-19: qty 5

## 2013-06-19 MED ORDER — BUPIVACAINE HCL (PF) 0.25 % IJ SOLN
INTRAMUSCULAR | Status: AC
  Filled 2013-06-19: qty 10

## 2013-06-19 MED ORDER — HYDROMORPHONE HCL PF 1 MG/ML IJ SOLN
0.2500 mg | INTRAMUSCULAR | Status: DC | PRN
  Administered 2013-06-19 (×3): 0.5 mg via INTRAVENOUS

## 2013-06-19 MED ORDER — BUPIVACAINE HCL (PF) 0.25 % IJ SOLN
INTRAMUSCULAR | Status: DC | PRN
  Administered 2013-06-19: 10 mL

## 2013-06-19 MED ORDER — ONDANSETRON HCL 4 MG/2ML IJ SOLN
INTRAMUSCULAR | Status: AC
  Filled 2013-06-19: qty 2

## 2013-06-19 MED ORDER — METOCLOPRAMIDE HCL 5 MG/ML IJ SOLN: 10.0000 mg | Freq: Once | INTRAMUSCULAR | Status: DC | PRN

## 2013-06-19 MED ORDER — MIDAZOLAM HCL 2 MG/2ML IJ SOLN
INTRAMUSCULAR | Status: DC | PRN
  Administered 2013-06-19: 2 mg via INTRAVENOUS

## 2013-06-19 MED ORDER — HYDROMORPHONE HCL PF 1 MG/ML IJ SOLN: 0.5000 mg | INTRAMUSCULAR | Status: DC | PRN

## 2013-06-19 MED ORDER — ROCURONIUM BROMIDE 100 MG/10ML IV SOLN
INTRAVENOUS | Status: DC | PRN
  Administered 2013-06-19: 35 mg via INTRAVENOUS

## 2013-06-19 MED ORDER — FENTANYL CITRATE 0.05 MG/ML IJ SOLN
25.0000 ug | INTRAMUSCULAR | Status: DC | PRN
  Administered 2013-06-19 (×3): 50 ug via INTRAVENOUS

## 2013-06-19 MED ORDER — DEXAMETHASONE SODIUM PHOSPHATE 10 MG/ML IJ SOLN
INTRAMUSCULAR | Status: DC | PRN
Start: 2013-06-19 — End: 2013-06-19
  Administered 2013-06-19: 10 mg via INTRAVENOUS

## 2013-06-19 MED ORDER — KETOROLAC TROMETHAMINE 30 MG/ML IJ SOLN
INTRAMUSCULAR | Status: AC
  Filled 2013-06-19: qty 1

## 2013-06-19 MED ORDER — LACTATED RINGERS IV SOLN
INTRAVENOUS | Status: DC
  Administered 2013-06-19 (×3): via INTRAVENOUS

## 2013-06-19 MED ORDER — HYDROCODONE-ACETAMINOPHEN 5-325 MG PO TABS
ORAL_TABLET | ORAL | Status: AC
  Filled 2013-06-19: qty 1

## 2013-06-19 SURGICAL SUPPLY — 18 items
ABLATOR ENDOMETRIAL BIPOLAR (ABLATOR) ×4 IMPLANT
CHLORAPREP W/TINT 26ML (MISCELLANEOUS) ×4 IMPLANT
CLIP FILSHIE TUBAL LIGA STRL (Clip) ×4 IMPLANT
CLOTH BEACON ORANGE TIMEOUT ST (SAFETY) ×4 IMPLANT
DRSG COVADERM PLUS 2X2 (GAUZE/BANDAGES/DRESSINGS) ×4 IMPLANT
GLOVE BIOGEL PI IND STRL 7.0 (GLOVE) ×14 IMPLANT
GLOVE BIOGEL PI INDICATOR 7.0 (GLOVE) ×14
GLOVE SURG SS PI 7.0 STRL IVOR (GLOVE) ×28 IMPLANT
GOWN STRL REUS W/TWL LRG LVL3 (GOWN DISPOSABLE) ×8 IMPLANT
NEEDLE INSUFFLATION 120MM (ENDOMECHANICALS) ×4 IMPLANT
PACK LAPAROSCOPY BASIN (CUSTOM PROCEDURE TRAY) ×4 IMPLANT
PACK VAGINAL MINOR WOMEN LF (CUSTOM PROCEDURE TRAY) ×4 IMPLANT
SUT VICRYL 0 UR6 27IN ABS (SUTURE) ×4 IMPLANT
SUT VICRYL 4-0 PS2 18IN ABS (SUTURE) ×4 IMPLANT
TOWEL OR 17X24 6PK STRL BLUE (TOWEL DISPOSABLE) ×8 IMPLANT
TROCAR XCEL DIL TIP R 11M (ENDOMECHANICALS) ×4 IMPLANT
WARMER LAPAROSCOPE (MISCELLANEOUS) ×4 IMPLANT
WATER STERILE IRR 1000ML POUR (IV SOLUTION) ×4 IMPLANT

## 2013-06-19 NOTE — Discharge Instructions (Addendum)
Laparoscopic Tubal Ligation Laparoscopic tubal ligation is a procedure that closes the fallopian tubes at a time other than right after childbirth. By closing the fallopian tubes, the eggs that are released from the ovaries cannot enter the uterus and sperm cannot reach the egg. Tubal ligation is also known as getting your "tubes tied." Tubal ligation is done so you will not be able to get pregnant or have a baby.  Although this procedure may be reversed, it should be considered permanent and irreversible. If you want to have future pregnancies, you should not have this procedure.  LET YOUR CAREGIVER KNOW ABOUT:  Allergies to food or medicine.  Medicines taken, including vitamins, herbs, eyedrops, over-the-counter medicines, and creams.  Use of steroids (by mouth or creams).  Previous problems with numbing medicines.  History of bleeding problems or blood clots.  Any recent colds or infections.  Previous surgery.  Other health problems, including diabetes and kidney problems.  Possibility of pregnancy, if this applies.  Any past pregnancies. RISKS AND COMPLICATIONS   Infection.  Bleeding.  Injury to surrounding organs.  Anesthetic side effects.  Failure of the procedure.  Ectopic pregnancy.  Future regret about having the procedure done. BEFORE THE PROCEDURE  Do not take aspirin or blood thinners a week before the procedure or as directed. This can cause bleeding.  Do not eat or drink anything 6 to 8 hours before the procedure. PROCEDURE   You may be given a medicine to help you relax (sedative) before the procedure. You will be given a medicine to make you sleep (general anesthetic) during the procedure.  A tube will be put down your throat to help your breath while under general anesthesia.  Two small cuts (incisions) are made in the lower abdominal area and near the belly button.  Your abdominal area will be inflated with a safe gas (carbon dioxide). This helps  give the surgeon room to operate, visualize, and helps the surgeon avoid other organs.  A thin, lighted tube (laparoscope) with a camera attached is inserted into your abdomen through one of the incisions near the belly button. Other small instruments are also inserted through the other abdominal incision.  The fallopian tubes are located and are either blocked with a ring, clip, or are burned (cauterized).  After the fallopian tubes are blocked, the gas is released from the abdomen.  The incisions will be closed with stitches (sutures), and a bandage may be placed over the incisions. AFTER THE PROCEDURE   You will rest in a recovery room for 1 4 hours until you are stable and doing well.  You will also have some mild abdominal discomfort for 3 7 days. You will be given pain medicine to ease any discomfort.  As long as there are no problems, you may be allowed to go home. Someone will need to drive you home and be with you for at least 24 hours once home.  You may have some mild discomfort in the throat. This is from the tube placed in your throat while you were sleeping.  You may experience discomfort in the shoulder area from some trapped air between the liver and diaphragm. This sensation is normal and will slowly go away on its own. Document Released: 07/26/2000 Document Revised: 10/19/2011 Document Reviewed: 07/31/2011 [facility] Patient Information 2014 [place]. Endometrial Ablation Endometrial ablation removes the lining of the uterus (endometrium). It is usually a same-day, outpatient treatment. Ablation helps avoid major surgery, such as surgery to remove the  cervix and uterus (hysterectomy). After endometrial ablation, you will have little or no menstrual bleeding and may not be able to have children. However, if you are premenopausal, you will need to use a reliable method of birth control following the procedure because of the small chance that pregnancy can occur. There are  different reasons to have this procedure, which include:  Heavy periods.  Bleeding that is causing anemia.  Irregular bleeding.  Bleeding fibroids on the lining inside the uterus if they are smaller than 3 centimeters. This procedure should not be done if:  You want children in the future.  You have severe cramps with your menstrual period.  You have precancerous or cancerous cells in your uterus.  You were recently pregnant.  You have gone through menopause.  You have had major surgery on the uterus, such as a cesarean delivery. LET [facility] CARE PROVIDER KNOW ABOUT:  Any allergies you have.  All medicines you are taking, including vitamins, herbs, eye drops, creams, and over-the-counter medicines.  Previous problems you or members of your family have had with the use of anesthetics.  Any blood disorders you have.  Previous surgeries you have had.  Medical conditions you have. RISKS AND COMPLICATIONS  Generally, this is a safe procedure. However, as with any procedure, complications can occur. Possible complications include:  Perforation of the uterus.  Bleeding.  Infection of the uterus, bladder, or vagina.  Injury to surrounding organs.  An air bubble to the lung (air embolus).  Pregnancy following the procedure.  Failure of the procedure to help the problem, requiring hysterectomy.  Decreased ability to diagnose cancer in the lining of the uterus. BEFORE THE PROCEDURE  The lining of the uterus must be tested to make sure there is no pre-cancerous or cancer cells present.  An ultrasound may be performed to look at the size of the uterus and to check for abnormalities.  Medicines may be given to thin the lining of the uterus. PROCEDURE  During the procedure, your health care provider will use a tool called a resectoscope to help see inside your uterus. There are different ways to remove the lining of your uterus.   Radiofrequency  This method uses a  radiofrequency-alternating electric current to remove the lining of the uterus.  Cryotherapy This method uses extreme cold to freeze the lining of the uterus.  Heated-Free Liquid  This method uses heated salt (saline) solution to remove the lining of the uterus.  Microwave This method uses high-energy microwaves to heat up the lining of the uterus to remove it.  Thermal balloon  This method involves inserting a catheter with a balloon tip into the uterus. The balloon tip is filled with heated fluid to remove the lining of the uterus. AFTER THE PROCEDURE  After your procedure, do not have sexual intercourse or insert anything into your vagina until permitted by your health care provider. After the procedure, you may experience:  Cramps.  Vaginal discharge.  Frequent urination. Document Released: 02/27/2004 Document Revised: 12/20/2012 Document Reviewed: 09/20/2012 [facility] Patient Information 2014 [place].   DO NOT TAKE ANY IBUPROFEN PRODUCTS UNTIL 8 PM TONIGHT.  YOU RECEIVED A SIMILAR MEDICATION IN THE OPERATING ROOM AT 2 PM.

## 2013-06-19 NOTE — H&P (Signed)
Gabriela Perez is an 33 y.o. female. G1P1001 Patient's last menstrual period was 05/07/2013. Patient requests sterilization and management of menorrhagia. Scheduled for NovaSure ablation and LBTL. The procedure and the risk of anesthesia, bleeding, infection, bowel and bladder injury, failure (1/200) and ectopic pregnancy were discussed and her questions were answered.   Pertinent Gynecological History: Menses: flow is excessive with use of   pads or tampons on heaviest days   Contraception: tubal ligation DES exposure: denies Blood transfusions: none Sexually transmitted diseases: no past history Previous GYN Procedures:    Last mammogram:     Last pap: normal  OB History: G1, P1    Patient's last menstrual period was 05/07/2013.    Past Medical History  Diagnosis Date  . Anemia   . Hypertension   . GERD (gastroesophageal reflux disease)   . History of blood transfusion 08/2012    [facility]  2 units transfused    Past Surgical History  Procedure Laterality Date  . Cesarean section  2003    Family History  Problem Relation Age of Onset  . Hypertension Mother   . Diabetes Mother   . Hypertension Father     Social History:  reports that she has never smoked. She has never used smokeless tobacco. She reports that she does not drink alcohol or use illicit drugs.  Allergies:  Allergies  Allergen Reactions  . Latex Itching    Prescriptions prior to admission  Medication Sig Dispense Refill  . Efinaconazole (JUBLIA) 10 % SOLN Apply 1 application topically as needed.      . FeFum-FePoly-FA-B Cmp-C-Biot (INTEGRA PLUS) CAPS Take 1 capsule by mouth daily.  30 capsule  3  . pantoprazole (PROTONIX) 40 MG tablet Take 40 mg by mouth daily.      . Hydrocodone-Acetaminophen 5-300 MG TABS Take 1-2 tablets by mouth every 6 (six) hours as needed (pain).  20 each  0  . losartan-hydrochlorothiazide (HYZAAR) 100-25 MG per tablet Take 1 tablet by mouth daily. Patient is ACEi intolerant  30  tablet  1  . terbinafine (LAMISIL) 250 MG tablet Take 250 mg by mouth daily.        Review of Systems  Constitutional: Negative for fever.  Respiratory: Negative.   Gastrointestinal: Negative.   Genitourinary: Negative for urgency.    Blood pressure 154/84, pulse 114, temperature 98.2 F (36.8 C), resp. rate 18, last menstrual period 05/07/2013. Physical Exam  Vitals reviewed. Constitutional: She appears well-developed. No distress.  Respiratory: Effort normal. No respiratory distress.  GI: Soft. There is no tenderness.  Skin: Skin is warm and dry.  Psychiatric: She has a normal mood and affect. Her behavior is normal.    Results for orders placed during the hospital encounter of 06/19/13 (from the past 24 hour(s))  PREGNANCY, URINE     Status: None   Collection Time    06/19/13 12:05 PM      Result Value Ref Range   Preg Test, Ur NEGATIVE  NEGATIVE    No results found.  Assessment/Plan: Sterilization and Novasure ablation today  Aurea Aronov 06/19/2013, 1:00 PM

## 2013-06-19 NOTE — Anesthesia Preprocedure Evaluation (Signed)
Anesthesia Evaluation  Patient identified by MRN, date of birth, ID band Patient awake    Reviewed: Allergy & Precautions, H&P , NPO status , Patient's Chart, lab work & pertinent test results  Airway Mallampati: III TM Distance: >3 FB Neck ROM: Full    Dental no notable dental hx. (+) Teeth Intact   Pulmonary neg pulmonary ROS,  breath sounds clear to auscultation  Pulmonary exam normal       Cardiovascular hypertension, Rhythm:Regular Rate:Normal     Neuro/Psych negative neurological ROS  negative psych ROS   GI/Hepatic Neg liver ROS, GERD-  Medicated and Controlled,Patient received Oral Contrast Agents,  Endo/Other  Morbid obesity  Renal/GU negative Renal ROS  negative genitourinary   Musculoskeletal negative musculoskeletal ROS (+)   Abdominal (+) + obese,   Peds  Hematology  (+) anemia ,   Anesthesia Other Findings   Reproductive/Obstetrics Menorrhagia Undesired fertility                           Anesthesia Physical Anesthesia Plan  ASA: III  Anesthesia Plan: General   Post-op Pain Management:    Induction: Intravenous  Airway Management Planned: Oral ETT  Additional Equipment:   Intra-op Plan:   Post-operative Plan: Extubation in OR  Informed Consent: I have reviewed the patients History and Physical, chart, labs and discussed the procedure including the risks, benefits and alternatives for the proposed anesthesia with the patient or authorized representative who has indicated his/her understanding and acceptance.   Dental advisory given  Plan Discussed with: CRNA, Anesthesiologist and Surgeon  Anesthesia Plan Comments:         Anesthesia Quick Evaluation

## 2013-06-19 NOTE — Anesthesia Postprocedure Evaluation (Signed)
  Anesthesia Post-op Note  Patient: Gabriela Perez  Procedure(s) Performed: Procedure(s): NOVASURE ABLATION (N/A) LAPAROSCOPIC TUBAL LIGATION (Bilateral)  Patient Location: PACU  Anesthesia Type:General  Level of Consciousness: awake, alert  and oriented  Airway and Oxygen Therapy: Patient Spontanous Breathing  Post-op Pain: mild  Post-op Assessment: Post-op Vital signs reviewed, Patient's Cardiovascular Status Stable, Respiratory Function Stable, Patent Airway, No signs of Nausea or vomiting and Pain level controlled  Post-op Vital Signs: Reviewed and stable  Complications: No apparent anesthesia complications

## 2013-06-19 NOTE — Transfer of Care (Signed)
Immediate Anesthesia Transfer of Care Note  Patient: Gabriela Perez  Procedure(s) Performed: Procedure(s): NOVASURE ABLATION (N/A) LAPAROSCOPIC TUBAL LIGATION (Bilateral)  Patient Location: PACU  Anesthesia Type:General  Level of Consciousness: awake, oriented and patient cooperative  Airway & Oxygen Therapy: Patient Spontanous Breathing and Patient connected to nasal cannula oxygen  Post-op Assessment: Report given to PACU RN and Post -op Vital signs reviewed and stable  Post vital signs: Reviewed and stable  Complications: No apparent anesthesia complications

## 2013-06-19 NOTE — Op Note (Signed)
Procedure: NovaSure endometrial ablation and laparoscopic bilateral tubal ligation Preoperative diagnosis: Undesired fertility and menorrhagia and anemia Postoperative diagnosis: Same Surgeon: Dr. Emeterio Reeve Assistant: Dr. Glyn Ade Anesthesia: Gen. endotracheal Estimated blood loss: Negligible Specimens: None Complications: None Drains: None Counts: Correct  Patient gave written consent for NovaSure endometrial ablation and laparoscopic bilateral tubal ligation. She requested permanent sterilization and therapy for menorrhagia. Endometrial biopsy was benign.   patient was confirmed and she was brought to the operating room and general anesthesia was induced. she was placed in dorsal lithotomy position. Exam under anesthesia revealed uterus about 4-6 weeks size no adnexal masses. Abdomen perineum and vagina were sterilely prepped and draped. Hulka tenaculum was placed on the cervix. Quarter percent Marcaine was infiltrated at the umbilicus and #82 blade was used to make a vertical incision about 1/2 cm long at the umbilicus. The abdominal cavity was elevated and Veress needle was placed. CO2 was insufflated and the needle was removed. 11 mm trocar was placed. Operative laparoscope was inserted. Patient was placed in Trendelenburg position. Uterus tubes and ovaries were visualized. There was a paratubal cyst on the left side. Filshie clip was placed about 3 cm from uterus on the left tube and good positioning was seen. Same was seen on the right side. Instruments were removed and CO2 was released. 0 Vicryl suture was used to close the fascia. Skin was closed with subcuticular suture of 4-0 Vicryl. Sterile dressing was applied. The Hulka tenaculum was removed. Speculum was inserted and cervix was visualized. Cervix was grasped with single-tooth tenaculum. Cervix was dilated sufficiently to pass the NovaSure device. The cavity length was calculated to be 4.5 cm and cavity width 4.3 cm. The NovaSure  procedure was completed using 505 W no complications. Instrument was removed. Patient tolerated the procedure well without complications and was brought in stable condition to PACU.    Woodroe Mode, MD 06/19/2013 2:57 PM

## 2013-06-20 ENCOUNTER — Other Ambulatory Visit: Payer: Self-pay | Admitting: Obstetrics & Gynecology

## 2013-06-20 ENCOUNTER — Encounter (HOSPITAL_COMMUNITY): Payer: Self-pay | Admitting: Obstetrics & Gynecology

## 2013-06-20 DIAGNOSIS — D259 Leiomyoma of uterus, unspecified: Secondary | ICD-10-CM

## 2013-06-20 MED ORDER — DICLOFENAC SODIUM 75 MG PO TBEC: 75.0000 mg | DELAYED_RELEASE_TABLET | Freq: Two times a day (BID) | ORAL | Status: DC

## 2013-07-08 ENCOUNTER — Inpatient Hospital Stay (HOSPITAL_COMMUNITY)
Admission: AD | Admit: 2013-07-08 | Discharge: 2013-07-08 | Disposition: A | Discharge status: Home / Self Care | Payer: Medicaid Other | Source: Ambulatory Visit | Attending: Obstetrics & Gynecology | Admitting: Obstetrics & Gynecology

## 2013-07-08 ENCOUNTER — Encounter (HOSPITAL_COMMUNITY): Payer: Self-pay | Admitting: *Deleted

## 2013-07-08 DIAGNOSIS — R109 Unspecified abdominal pain: Secondary | ICD-10-CM | POA: Insufficient documentation

## 2013-07-08 DIAGNOSIS — I10 Essential (primary) hypertension: Secondary | ICD-10-CM | POA: Insufficient documentation

## 2013-07-08 DIAGNOSIS — N719 Inflammatory disease of uterus, unspecified: Secondary | ICD-10-CM | POA: Insufficient documentation

## 2013-07-08 DIAGNOSIS — K219 Gastro-esophageal reflux disease without esophagitis: Secondary | ICD-10-CM | POA: Insufficient documentation

## 2013-07-08 DIAGNOSIS — N710 Acute inflammatory disease of uterus: Secondary | ICD-10-CM

## 2013-07-08 DIAGNOSIS — D649 Anemia, unspecified: Secondary | ICD-10-CM | POA: Insufficient documentation

## 2013-07-08 LAB — CBC
HEMATOCRIT: 24.9 % — AB (ref 36.0–46.0)
Hemoglobin: 7.4 g/dL — ABNORMAL LOW (ref 12.0–15.0)
MCH: 19.4 pg — ABNORMAL LOW (ref 26.0–34.0)
MCHC: 29.7 g/dL — AB (ref 30.0–36.0)
MCV: 65.4 fL — AB (ref 78.0–100.0)
Platelets: 480 10*3/uL — ABNORMAL HIGH (ref 150–400)
RBC: 3.81 MIL/uL — ABNORMAL LOW (ref 3.87–5.11)
RDW: 18 % — AB (ref 11.5–15.5)
WBC: 4.3 10*3/uL (ref 4.0–10.5)

## 2013-07-08 LAB — WET PREP, GENITAL
Trich, Wet Prep: NONE SEEN
Yeast Wet Prep HPF POC: NONE SEEN

## 2013-07-08 LAB — URINALYSIS, ROUTINE W REFLEX MICROSCOPIC
Bilirubin Urine: NEGATIVE
GLUCOSE, UA: NEGATIVE mg/dL
HGB URINE DIPSTICK: NEGATIVE
KETONES UR: NEGATIVE mg/dL
LEUKOCYTES UA: NEGATIVE
Nitrite: NEGATIVE
Protein, ur: NEGATIVE mg/dL
Specific Gravity, Urine: 1.02 (ref 1.005–1.030)
Urobilinogen, UA: 0.2 mg/dL (ref 0.0–1.0)
pH: 7 (ref 5.0–8.0)

## 2013-07-08 MED ORDER — CEFTRIAXONE SODIUM 1 G IJ SOLR
1.0000 g | Freq: Once | INTRAMUSCULAR | Status: AC
  Administered 2013-07-08: 1 g via INTRAMUSCULAR
  Filled 2013-07-08: qty 10

## 2013-07-08 MED ORDER — HYDROMORPHONE HCL PF 1 MG/ML IJ SOLN
1.0000 mg | Freq: Once | INTRAMUSCULAR | Status: AC
  Administered 2013-07-08: 1 mg via INTRAMUSCULAR
  Filled 2013-07-08: qty 1

## 2013-07-08 MED ORDER — OXYCODONE-ACETAMINOPHEN 5-325 MG PO TABS: 1.0000 | ORAL_TABLET | Freq: Four times a day (QID) | ORAL | Status: DC | PRN

## 2013-07-08 MED ORDER — KETOROLAC TROMETHAMINE 10 MG PO TABS: 10.0000 mg | ORAL_TABLET | Freq: Four times a day (QID) | ORAL | Status: DC | PRN

## 2013-07-08 MED ORDER — DOXYCYCLINE HYCLATE 100 MG PO CAPS: 100.0000 mg | ORAL_CAPSULE | Freq: Two times a day (BID) | ORAL | Status: DC

## 2013-07-08 NOTE — MAU Note (Signed)
Pt presents with complaints of abdominal pain, she had surgery on February the 17th, BTL and an abliacian. She has been taking vicodin and it doesn't seem to be helping and she thought the pain would be gone by now.

## 2013-07-08 NOTE — MAU Provider Note (Signed)
History     CSN: [id number]  Arrival date and time: 07/08/13 1502   First Provider Initiated Contact with Patient 07/08/13 1600      Chief Complaint  Patient presents with  . Abdominal Pain   HPI [name] is a 33 y.o. G1P1001 who presents to MAU ~3  weeks s/p Novasure ablation and BTL. The patient states that the pain had stopped post operatively and restarted 2 days ago. She states she ha had a yellow, thin discharge x 1 week. She endorses 8/10 lower abdominal pain. She last took ibuprofen at 1130 today with no relief. She denies fever, vaginal bleeding, N/V/D or constipation or intercourse since her surgery.   OB History   Grav Para Term Preterm Abortions TAB SAB Ect Mult Living   1 1 1  0 0 0 0 0 0 1      Past Medical History  Diagnosis Date  . Anemia   . Hypertension   . GERD (gastroesophageal reflux disease)   . History of blood transfusion 08/2012    [facility]  2 units transfused    Past Surgical History  Procedure Laterality Date  . Cesarean section  2003  . Novasure ablation N/A 06/19/2013    Procedure: NOVASURE ABLATION;  Surgeon: Woodroe Mode, MD;  Location: [place] [facility];  Service: Gynecology;  Laterality: N/A;  . Laparoscopic tubal ligation Bilateral 06/19/2013    Procedure: LAPAROSCOPIC TUBAL LIGATION;  Surgeon: Woodroe Mode, MD;  Location: [facility];  Service: Gynecology;  Laterality: Bilateral;    Family History  Problem Relation Age of Onset  . Hypertension Mother   . Diabetes Mother   . Hypertension Father     History  Substance Use Topics  . Smoking status: Never Smoker   . Smokeless tobacco: Never Used  . Alcohol Use: No    Allergies:  Allergies  Allergen Reactions  . Latex Itching    Prescriptions prior to admission  Medication Sig Dispense Refill  . Efinaconazole (JUBLIA) 10 % SOLN Apply 1 application topically as needed.      . FeFum-FePoly-FA-B Cmp-C-Biot (INTEGRA PLUS) CAPS Take 1 capsule by mouth daily.  30 capsule  3  .  losartan-hydrochlorothiazide (HYZAAR) 100-25 MG per tablet Take 1 tablet by mouth daily. Patient is ACEi intolerant  30 tablet  1  . pantoprazole (PROTONIX) 40 MG tablet Take 40 mg by mouth daily.      Marland Kitchen terbinafine (LAMISIL) 250 MG tablet Take 250 mg by mouth daily.      . diclofenac (VOLTAREN) 75 MG EC tablet Take 1 tablet (75 mg total) by mouth 2 (two) times daily with a meal.  60 tablet  2  . Hydrocodone-Acetaminophen 5-300 MG TABS Take 1-2 tablets by mouth every 6 (six) hours as needed.  12 each  0    Review of Systems  Constitutional: Negative for fever and malaise/fatigue.  Gastrointestinal: Positive for abdominal pain. Negative for nausea, vomiting, diarrhea and constipation.  Genitourinary: Negative for dysuria, urgency and frequency.       + vaginal discharge Neg - vaginal bleeding   Physical Exam   Blood pressure 136/69, pulse 72, temperature 98 F (36.7 C), resp. rate 18, height 5\' 8"  (1.727 m), weight 307 lb (139.254 kg).  Physical Exam  Constitutional: She is oriented to person, place, and time. She appears well-developed and well-nourished. No distress.  HENT:  Head: Normocephalic and atraumatic.  Cardiovascular: Normal rate.   Respiratory: Effort normal.  GI: Soft. Bowel sounds are normal.  She exhibits no distension and no mass. There is tenderness (moderate suprapubic tenderness to palpation). There is no rebound and no guarding.  Genitourinary: Uterus is tender (severe tenderness to palpation of the suprapubic region). Uterus is not enlarged. Cervix exhibits discharge (thin, yellow, watery discharge noted at the cervix). Cervix exhibits no motion tenderness and no friability. Right adnexum displays no mass and no tenderness. Left adnexum displays no mass and no tenderness. No bleeding around the vagina. Vaginal discharge (thin, watery, yellow discharge noted) found.  Neurological: She is alert and oriented to person, place, and time.  Skin: Skin is warm and dry. No  erythema.  Psychiatric: She has a normal mood and affect.   Results for orders placed during the hospital encounter of 07/08/13 (from the past 24 hour(s))  URINALYSIS, ROUTINE W REFLEX MICROSCOPIC     Status: None   Collection Time    07/08/13  3:20 PM      Result Value Ref Range   Color, Urine YELLOW  YELLOW   APPearance CLEAR  CLEAR   Specific Gravity, Urine 1.020  1.005 - 1.030   pH 7.0  5.0 - 8.0   Glucose, UA NEGATIVE  NEGATIVE mg/dL   Hgb urine dipstick NEGATIVE  NEGATIVE   Bilirubin Urine NEGATIVE  NEGATIVE   Ketones, ur NEGATIVE  NEGATIVE mg/dL   Protein, ur NEGATIVE  NEGATIVE mg/dL   Urobilinogen, UA 0.2  0.0 - 1.0 mg/dL   Nitrite NEGATIVE  NEGATIVE   Leukocytes, UA NEGATIVE  NEGATIVE    MAU Course  Procedures None  MDM UA, wet prep, GC/Chlamydia and CBC today Discussed with Dr. Elonda Husky. Recommends 1 G Rocephin IM and Rx for 100 mg Doxycycline BID x 10 days, Toradol and Percocet 1 G Rocephin IM given in MAU  Assessment and Plan  A: S/P Novasure ablation and BTL Endometritis infectious vs inflammatory   P: Discharge home Rx for Doxycycline, Toradol and Percocet given/sent to patient's pharmacy Patient advised to call the clinic for an earlier appointment of return to MAU if she develops fever or worsening of symtpoms Patient advised to keep scheduled follow-up with [facility] clinic for routine post operative appointment Patient may return to MAU as needed or if her condition were to change or worsen  Farris Has, PA-C  07/08/2013, 4:12 PM

## 2013-07-08 NOTE — Discharge Instructions (Signed)
Endometritis Endometritis is an irritation, soreness, and swelling (inflammation) of the lining of the uterus (endometrium).  CAUSES   Bacterial infections.  Sexually transmitted infections (STIs).  Having a miscarriage or childbirth, especially after a long labor or cesarean delivery.  Certain gynecological procedures (such as dilation and curettage, hysteroscopy, or contraceptive insertion). SIGNS AND SYMPTOMS   Fever.  Lower abdominal or pelvic pain.  Abnormal vaginal discharge or bleeding.  Abdominal bloating (distention) or swelling.  General discomfort or ill feeling.  Discomfort with bowel movements. DIAGNOSIS  A physical and pelvic exam are performed. Other tests may include:  Cultures from the cervix.  Blood tests.  Examining a tissue sample of the uterine lining (endometrial biopsy).  Examining discharge under a microscope (wet prep).  Laparoscopy. TREATMENT  Antibiotic medicines are usually given. Other treatments may include:  Fluids through an IV tube inserted in your vein.  Rest. HOME CARE INSTRUCTIONS   Take over-the-counter or prescription medicines for pain, discomfort, or fever as directed by your health care provider.  Take your antibiotics as directed. Finish them even if you start to feel better.  Resume your normal diet and activities as directed or as tolerated.  Do not douche or have sexual intercourse until your health care provider says it is okay.  Do not have sexual intercourse until your partner has been treated if your endometritis is caused by an STI. SEEK IMMEDIATE MEDICAL CARE IF:   You have swelling or increasing pain in the abdomen.  You have a fever.  You have bad smelling vaginal discharge, or you have an increased amount of discharge.  You have abnormal vaginal bleeding.  Your medicine is not helping with the pain.  You experience any problems that may be related to the medicine you are taking.  You have nausea  and vomiting, or you cannot keep foods down.  You have pain with bowel movements. MAKE SURE YOU:   Understand these instructions.  Will watch your condition.  Will get help right away if you are not doing well or get worse. Document Released: 04/13/2001 Document Revised: 12/20/2012 Document Reviewed: 11/16/2012 [facility] Patient Information 2014 [place], [place].

## 2013-07-09 LAB — GC/CHLAMYDIA PROBE AMP
CT Probe RNA: NEGATIVE
GC Probe RNA: NEGATIVE

## 2013-07-10 LAB — WOUND CULTURE
CULTURE: NORMAL
Gram Stain: NONE SEEN

## 2013-07-26 ENCOUNTER — Ambulatory Visit (INDEPENDENT_AMBULATORY_CARE_PROVIDER_SITE_OTHER): Payer: Medicaid Other | Admitting: Obstetrics & Gynecology

## 2013-07-26 ENCOUNTER — Encounter: Payer: Self-pay | Admitting: Obstetrics & Gynecology

## 2013-07-26 VITALS — BP 135/90 | HR 78 | Temp 97.8°F | Ht 67.0 in | Wt 307.8 lb

## 2013-07-26 DIAGNOSIS — N920 Excessive and frequent menstruation with regular cycle: Secondary | ICD-10-CM

## 2013-07-26 DIAGNOSIS — Z09 Encounter for follow-up examination after completed treatment for conditions other than malignant neoplasm: Secondary | ICD-10-CM

## 2013-07-26 NOTE — Progress Notes (Signed)
Patient ID: Amorah Sebring, female   DOB: 06-16-1980, 33 y.o.   MRN: [id number] Subjective:     Kyana Aicher is a 33 y.o. female who presents to the clinic 5 weeks status post  for ablation and LBTL. Eating a regular diet without difficulty. Bowel movements are normal. The patient is not having any pain.  The following portions of the patient's history were reviewed and updated as appropriate: allergies, current medications, past family history, past medical history, past social history, past surgical history and problem list.  Review of Systems Pertinent items are noted in HPI.    Objective:    BP 135/90  Pulse 78  Temp(Src) 97.8 F (36.6 C) (Oral)  Ht 5\' 7"  (1.702 m)  Wt 307 lb 12.8 oz (139.617 kg)  BMI 48.20 kg/m2 General:  alert, cooperative and no distress  Abdomen: soft, bowel sounds active, non-tender  Incision:   healing well, no drainage, no erythema, no hernia, no seroma, no swelling, no dehiscence, incision well approximated     Assessment:    Doing well postoperatively. Operative findings again reviewed. Pathology report discussed.    Plan:    1. Continue any current medications. 2. Wound care discussed. 3. Activity restrictions: none 4. Anticipated return to work: now. 5. Follow up: as needed  Woodroe Mode, MD 07/26/2013

## 2013-07-26 NOTE — Patient Instructions (Signed)

## 2013-08-12 ENCOUNTER — Emergency Department (HOSPITAL_COMMUNITY)
Admission: EM | Admit: 2013-08-12 | Discharge: 2013-08-12 | Disposition: A | Discharge status: Home / Self Care | Payer: Medicaid Other | Source: Home / Self Care

## 2013-08-13 ENCOUNTER — Encounter (HOSPITAL_COMMUNITY): Payer: Self-pay | Admitting: Emergency Medicine

## 2013-08-13 ENCOUNTER — Emergency Department (HOSPITAL_COMMUNITY)
Admission: EM | Admit: 2013-08-13 | Discharge: 2013-08-13 | Disposition: A | Discharge status: Home / Self Care | Payer: Medicaid Other | Source: Home / Self Care | Attending: Emergency Medicine | Admitting: Emergency Medicine

## 2013-08-13 DIAGNOSIS — I10 Essential (primary) hypertension: Secondary | ICD-10-CM

## 2013-08-13 DIAGNOSIS — Z760 Encounter for issue of repeat prescription: Secondary | ICD-10-CM

## 2013-08-13 MED ORDER — LOSARTAN POTASSIUM-HCTZ 100-25 MG PO TABS: 1.0000 | ORAL_TABLET | Freq: Every day | ORAL | Status: AC

## 2013-08-13 NOTE — ED Notes (Signed)
C/o HA for a few days; out of BP medication x 1 week. NAD

## 2013-08-13 NOTE — ED Provider Notes (Signed)
CSN: [id number]     Arrival date & time 08/13/13  0800 History   First MD Initiated Contact with Patient 08/13/13 [phone]     Chief Complaint  Patient presents with  . Headache   (Consider location/radiation/quality/duration/timing/severity/associated sxs/prior Treatment) HPI Comments: Patient reports that she ran out of her Hyzaar about one week ago and that being without this medication has caused her to develop a headache over the past two days. She states she is not able to obtain an appointment with her PCP until 09/04/2013 and requests Rx for her Hyzaar until she can be seen by her PCP. PCP: Dr. Jimmye Norman @ [facility] on [address] in [place].  The history is provided by the patient.    Past Medical History  Diagnosis Date  . Anemia   . Hypertension   . GERD (gastroesophageal reflux disease)   . History of blood transfusion 08/2012    [facility]  2 units transfused   Past Surgical History  Procedure Laterality Date  . Cesarean section  2003  . Novasure ablation N/A 06/19/2013    Procedure: NOVASURE ABLATION;  Surgeon: Woodroe Mode, MD;  Location: [facility];  Service: Gynecology;  Laterality: N/A;  . Laparoscopic tubal ligation Bilateral 06/19/2013    Procedure: LAPAROSCOPIC TUBAL LIGATION;  Surgeon: Woodroe Mode, MD;  Location: [facility];  Service: Gynecology;  Laterality: Bilateral;   Family History  Problem Relation Age of Onset  . Hypertension Mother   . Diabetes Mother   . Hypertension Father    History  Substance Use Topics  . Smoking status: Never Smoker   . Smokeless tobacco: Never Used  . Alcohol Use: No   OB History   Grav Para Term Preterm Abortions TAB SAB Ect Mult Living   1 1 1  0 0 0 0 0 0 1     Review of Systems  All other systems reviewed and are negative.   Allergies  Latex  Home Medications   Current Outpatient Rx  Name  Route  Sig  Dispense  Refill  . losartan-hydrochlorothiazide (HYZAAR) 100-25 MG per tablet   Oral   Take 1 tablet by mouth  daily. Patient is ACEi intolerant   30 tablet   1   . pantoprazole (PROTONIX) 40 MG tablet   Oral   Take 40 mg by mouth daily.         Marland Kitchen doxycycline (VIBRAMYCIN) 100 MG capsule   Oral   Take 1 capsule (100 mg total) by mouth 2 (two) times daily.   20 capsule   0   . Efinaconazole (JUBLIA) 10 % SOLN   Apply externally   Apply 1 application topically as needed.         . FeFum-FePoly-FA-B Cmp-C-Biot (INTEGRA PLUS) CAPS   Oral   Take 1 capsule by mouth daily.   30 capsule   3   . ketorolac (TORADOL) 10 MG tablet   Oral   Take 1 tablet (10 mg total) by mouth every 6 (six) hours as needed.   30 tablet   0   . losartan-hydrochlorothiazide (HYZAAR) 100-25 MG per tablet   Oral   Take 1 tablet by mouth daily.   30 tablet   0   . oxyCODONE-acetaminophen (PERCOCET/ROXICET) 5-325 MG per tablet   Oral   Take 1-2 tablets by mouth every 6 (six) hours as needed for severe pain.   20 tablet   0   . terbinafine (LAMISIL) 250 MG tablet  Oral   Take 250 mg by mouth daily.          BP 113/71  Pulse 62  Temp(Src) 97.3 F (36.3 C) (Oral)  Resp 14  SpO2 99%  LMP 08/11/2013 Physical Exam  Nursing note and vitals reviewed. Constitutional: She is oriented to person, place, and time. She appears well-developed and well-nourished.  +obese  HENT:  Head: Normocephalic and atraumatic.  Right Ear: External ear normal.  Left Ear: External ear normal.  Nose: Nose normal.  Mouth/Throat: Oropharynx is clear and moist.  Eyes: Conjunctivae and EOM are normal. Pupils are equal, round, and reactive to light.  Neck: Normal range of motion. Neck supple. No JVD present.  Cardiovascular: Normal rate, regular rhythm and normal heart sounds.   Pulmonary/Chest: Effort normal and breath sounds normal.  Abdominal: Soft. There is no tenderness.  Musculoskeletal: Normal range of motion.  Neurological: She is alert and oriented to person, place, and time.  Skin: Skin is warm and dry. No rash  noted.  Psychiatric: She has a normal mood and affect. Her behavior is normal.    ED Course  Procedures (including critical care time) Labs Review Labs Reviewed - No data to display Imaging Review No results found.   MDM   1. Encounter for medication refill   Encouraged to continue follow up with her PCP.    [place], [place] 08/13/13 [phone]

## 2013-08-13 NOTE — ED Provider Notes (Signed)
Medical screening examination/treatment/procedure(s) were performed by non-physician practitioner and as supervising physician I was immediately available for consultation/collaboration.  Philipp Deputy, M.D.  Harden Mo, MD 08/13/13 [phone]

## 2013-08-13 NOTE — Discharge Instructions (Signed)
Medication Refill, Emergency Department  We have refilled your medication today as a courtesy to you. It is best for your medical care, however, to take care of getting refills done through your primary caregiver's office. They have your records and can do a better job of follow-up than we can in the emergency department.  On maintenance medications, we often only prescribe enough medications to get you by until you are able to see your regular caregiver. This is a more expensive way to refill medications.  In the future, please plan for refills so that you will not have to use the emergency department for this.  Thank you for your help. Your help allows us to better take care of the daily emergencies that enter our department.  Document Released: 08/06/2003 Document Revised: 07/12/2011 Document Reviewed: 04/19/2005  ExitCare® Patient Information ©2014 [employer].

## 2013-08-24 ENCOUNTER — Encounter: Payer: Self-pay | Admitting: *Deleted

## 2013-10-31 ENCOUNTER — Ambulatory Visit (INDEPENDENT_AMBULATORY_CARE_PROVIDER_SITE_OTHER): Payer: Medicaid Other | Admitting: Obstetrics & Gynecology

## 2013-10-31 ENCOUNTER — Encounter: Payer: Self-pay | Admitting: Obstetrics & Gynecology

## 2013-10-31 VITALS — BP 122/79 | HR 77 | Temp 98.5°F | Ht 68.0 in | Wt 315.7 lb

## 2013-10-31 DIAGNOSIS — D259 Leiomyoma of uterus, unspecified: Secondary | ICD-10-CM

## 2013-10-31 DIAGNOSIS — N920 Excessive and frequent menstruation with regular cycle: Secondary | ICD-10-CM

## 2013-10-31 MED ORDER — TRAMADOL-ACETAMINOPHEN 37.5-325 MG PO TABS: 1.0000 | ORAL_TABLET | Freq: Four times a day (QID) | ORAL | Status: AC | PRN

## 2013-10-31 NOTE — Progress Notes (Signed)
Subjective:     Patient ID: Gabriela Perez, female   DOB: July 18, 1980, 33 y.o.   MRN: [id number]  HPI [id number] Patient's last menstrual period was 10/26/2013. S/p ablation 06/2013, last 3 cycles have returned to heavy flow and pain not relieved by NSAID.    Review of Systems  Constitutional: Negative.   Respiratory: Negative.   Genitourinary: Positive for menstrual problem. Negative for vaginal discharge.       Objective:   Physical Exam  Constitutional: She is oriented to person, place, and time. She appears well-developed. No distress.  Pulmonary/Chest: Effort normal. No respiratory distress.  Neurological: She is alert and oriented to person, place, and time.  Psychiatric: She has a normal mood and affect. Her behavior is normal.       Assessment:     Menorrhagia and dysmenorrhea s/p endometrial ablation      Plan:     Repeat US to assess fibroid and RTC to discuss options  Woodroe Mode, MD 10/31/2013

## 2013-10-31 NOTE — Patient Instructions (Signed)

## 2013-11-13 ENCOUNTER — Ambulatory Visit (HOSPITAL_COMMUNITY)
Admission: RE | Admit: 2013-11-13 | Discharge: 2013-11-13 | Disposition: A | Discharge status: Home / Self Care | Payer: Medicaid Other | Source: Ambulatory Visit | Attending: Obstetrics & Gynecology | Admitting: Obstetrics & Gynecology

## 2013-11-13 DIAGNOSIS — N83209 Unspecified ovarian cyst, unspecified side: Secondary | ICD-10-CM | POA: Insufficient documentation

## 2013-11-13 DIAGNOSIS — N839 Noninflammatory disorder of ovary, fallopian tube and broad ligament, unspecified: Secondary | ICD-10-CM | POA: Diagnosis not present

## 2013-11-13 DIAGNOSIS — D259 Leiomyoma of uterus, unspecified: Secondary | ICD-10-CM | POA: Diagnosis present

## 2013-11-13 DIAGNOSIS — N920 Excessive and frequent menstruation with regular cycle: Secondary | ICD-10-CM

## 2013-11-20 ENCOUNTER — Telehealth: Payer: Self-pay

## 2013-11-20 NOTE — Telephone Encounter (Signed)
Patient called stating she saw Dr. Roselie Awkward recently for pelvic pain and menstrual cramping and was given tramadol for pain. Patient is requesting something stronger as she has tried tramadol, toradol and ibuprofen 800mg  without relief-- would like a call back.   Called patient who states she had an ablation in February to help with pain and bleeding-- states her first two periods afterwards were fine. However, her third and subsequent periods became more and more painful and are now intolerable. Reports menses started last night and she is doubled over in pain. Has tried tramadol and the 800mg  Ibuprofen without relief. Had U/S last week and an appointment for f/u 12/13/13. Informed patient it is unlikely he will prescribe anything stronger until she is seen but that I would message Dr. Roselie Awkward to see what he advises. Patient verbalized understanding and gratitude.

## 2013-11-22 ENCOUNTER — Other Ambulatory Visit: Payer: Self-pay | Admitting: Obstetrics & Gynecology

## 2013-11-22 MED ORDER — HYDROCODONE-ACETAMINOPHEN 5-300 MG PO TABS: 1.0000 | ORAL_TABLET | Freq: Four times a day (QID) | ORAL | Status: AC | PRN

## 2013-11-23 NOTE — Telephone Encounter (Signed)
Message copied by Geanie Logan on Fri Nov 23, 2013  7:34 AM ------      Message from: Woodroe Mode      Created: Thu Nov 22, 2013  4:54 PM       Has Rx waiting for vicodin to pick up ------

## 2013-11-23 NOTE — Telephone Encounter (Signed)
Called patient and informed her RX is here in clinic ready for pick up. Explained that this medication is to be used only when pain is severe and that she should try ibuprofen and comfort measures prior to resorting to Vicodin as this is just to hold her over until next appointment. Patient verbalized understanding and stated she will be here before 1130 to pick up RX.

## 2013-12-03 ENCOUNTER — Inpatient Hospital Stay (HOSPITAL_COMMUNITY)
Admission: AD | Admit: 2013-12-03 | Discharge: 2013-12-04 | Disposition: A | Discharge status: Home / Self Care | Payer: Medicaid Other | Source: Ambulatory Visit | Attending: Obstetrics & Gynecology | Admitting: Obstetrics & Gynecology

## 2013-12-03 DIAGNOSIS — N920 Excessive and frequent menstruation with regular cycle: Secondary | ICD-10-CM | POA: Diagnosis not present

## 2013-12-03 DIAGNOSIS — K219 Gastro-esophageal reflux disease without esophagitis: Secondary | ICD-10-CM | POA: Insufficient documentation

## 2013-12-03 DIAGNOSIS — I10 Essential (primary) hypertension: Secondary | ICD-10-CM | POA: Insufficient documentation

## 2013-12-03 DIAGNOSIS — D500 Iron deficiency anemia secondary to blood loss (chronic): Secondary | ICD-10-CM

## 2013-12-03 DIAGNOSIS — R42 Dizziness and giddiness: Secondary | ICD-10-CM | POA: Insufficient documentation

## 2013-12-03 DIAGNOSIS — D259 Leiomyoma of uterus, unspecified: Secondary | ICD-10-CM | POA: Insufficient documentation

## 2013-12-03 LAB — URINALYSIS, ROUTINE W REFLEX MICROSCOPIC
Bilirubin Urine: NEGATIVE
GLUCOSE, UA: NEGATIVE mg/dL
Hgb urine dipstick: NEGATIVE
Ketones, ur: 15 mg/dL — AB
LEUKOCYTES UA: NEGATIVE
Nitrite: NEGATIVE
PH: 6 (ref 5.0–8.0)
Protein, ur: NEGATIVE mg/dL
Specific Gravity, Urine: 1.025 (ref 1.005–1.030)
Urobilinogen, UA: 0.2 mg/dL (ref 0.0–1.0)

## 2013-12-03 LAB — POCT PREGNANCY, URINE: PREG TEST UR: NEGATIVE

## 2013-12-03 NOTE — MAU Note (Signed)
Pt reports she has had dizziness for the last 2 days, h/o of fibroids is currently having lower abd pain.

## 2013-12-04 ENCOUNTER — Encounter (HOSPITAL_COMMUNITY): Payer: Self-pay | Admitting: *Deleted

## 2013-12-04 DIAGNOSIS — N920 Excessive and frequent menstruation with regular cycle: Secondary | ICD-10-CM

## 2013-12-04 LAB — CBC
HEMATOCRIT: 27.8 % — AB (ref 36.0–46.0)
Hemoglobin: 8.1 g/dL — ABNORMAL LOW (ref 12.0–15.0)
MCH: 19.7 pg — AB (ref 26.0–34.0)
MCHC: 29.1 g/dL — ABNORMAL LOW (ref 30.0–36.0)
MCV: 67.6 fL — ABNORMAL LOW (ref 78.0–100.0)
Platelets: 437 10*3/uL — ABNORMAL HIGH (ref 150–400)
RBC: 4.11 MIL/uL (ref 3.87–5.11)
RDW: 18.6 % — ABNORMAL HIGH (ref 11.5–15.5)
WBC: 6 10*3/uL (ref 4.0–10.5)

## 2013-12-04 LAB — COMPREHENSIVE METABOLIC PANEL
ALK PHOS: 49 U/L (ref 39–117)
ALT: 11 U/L (ref 0–35)
ANION GAP: 10 (ref 5–15)
AST: 19 U/L (ref 0–37)
Albumin: 3.3 g/dL — ABNORMAL LOW (ref 3.5–5.2)
BUN: 10 mg/dL (ref 6–23)
CO2: 26 meq/L (ref 19–32)
Calcium: 8.6 mg/dL (ref 8.4–10.5)
Chloride: 100 mEq/L (ref 96–112)
Creatinine, Ser: 0.57 mg/dL (ref 0.50–1.10)
Glucose, Bld: 95 mg/dL (ref 70–99)
POTASSIUM: 3.7 meq/L (ref 3.7–5.3)
SODIUM: 136 meq/L — AB (ref 137–147)
TOTAL PROTEIN: 7.4 g/dL (ref 6.0–8.3)
Total Bilirubin: <0.2 mg/dL — ABNORMAL LOW (ref 0.3–1.2)

## 2013-12-04 MED ORDER — MEGESTROL ACETATE 40 MG PO TABS: 40.0000 mg | ORAL_TABLET | Freq: Two times a day (BID) | ORAL | Status: AC

## 2013-12-04 MED ORDER — KETOROLAC TROMETHAMINE 60 MG/2ML IM SOLN
60.0000 mg | INTRAMUSCULAR | Status: AC
  Administered 2013-12-04: 60 mg via INTRAMUSCULAR
  Filled 2013-12-04: qty 2

## 2013-12-04 MED ORDER — OXYCODONE-ACETAMINOPHEN 5-325 MG PO TABS
2.0000 | ORAL_TABLET | ORAL | Status: AC
  Administered 2013-12-04: 2 via ORAL
  Filled 2013-12-04: qty 2

## 2013-12-04 MED ORDER — OXYCODONE-ACETAMINOPHEN 5-325 MG PO TABS: 2.0000 | ORAL_TABLET | ORAL | Status: DC

## 2013-12-04 NOTE — MAU Provider Note (Signed)
Chief Complaint: Dizziness and Abdominal Pain   First Provider Initiated Contact with Patient 12/04/13 0112     SUBJECTIVE HPI: Gabriela Perez is a 33 y.o. G1P1001 at Unknown by LMP who presents to maternity admissions reporting dizziness and near syncope today in the shower.  She also has abdominal cramping consistent with pain she has with her uterine fibroid. She has history of menorrhagia and anemia with blood transfusion 4/14.  Then, she had uterine ablation 2/15 which reduced her pain and bleeding for 2-3 months but both returned. She was seen 10/31/13 by Dr Roselie Awkward and had pelvic U/S following this visit showing uterine fibroid.  Patient's last menstrual period was 11/14/2013. This period was heavy and painful.  She is not bleeding today.  She denies vaginal itching/burning, urinary symptoms, h/a, n/v, or fever/chills.     Past Medical History  Diagnosis Date  . Anemia   . Hypertension   . GERD (gastroesophageal reflux disease)   . History of blood transfusion 08/2012    [facility]  2 units transfused   Past Surgical History  Procedure Laterality Date  . Cesarean section  2003  . Novasure ablation N/A 06/19/2013    Procedure: NOVASURE ABLATION;  Surgeon: Woodroe Mode, MD;  Location: [facility];  Service: Gynecology;  Laterality: N/A;  . Laparoscopic tubal ligation Bilateral 06/19/2013    Procedure: LAPAROSCOPIC TUBAL LIGATION;  Surgeon: Woodroe Mode, MD;  Location: [facility];  Service: Gynecology;  Laterality: Bilateral;   History   Social History  . Marital Status: Single    Spouse Name: N/A    Number of Children: N/A  . Years of Education: N/A   Occupational History  . Not on file.   Social History Main Topics  . Smoking status: Never Smoker   . Smokeless tobacco: Never Used  . Alcohol Use: Yes     Comment: occasionally  . Drug Use: No  . Sexual Activity: Yes    Birth Control/ Protection: None     Comment: last sex Dec 02 2013   Other Topics Concern  . Not on file   Social  History Narrative  . No narrative on file   No current facility-administered medications on file prior to encounter.   Current Outpatient Prescriptions on File Prior to Encounter  Medication Sig Dispense Refill  . FeFum-FePoly-FA-B Cmp-C-Biot (INTEGRA PLUS) CAPS Take 1 capsule by mouth daily.  30 capsule  3  . losartan-hydrochlorothiazide (HYZAAR) 100-25 MG per tablet Take 1 tablet by mouth daily.  30 tablet  0  . pantoprazole (PROTONIX) 40 MG tablet Take 40 mg by mouth daily.      . Hydrocodone-Acetaminophen 5-300 MG TABS Take 1-2 tablets by mouth every 6 (six) hours as needed.  15 each  0  . traMADol-acetaminophen (ULTRACET) 37.5-325 MG per tablet Take 1-2 tablets by mouth every 6 (six) hours as needed.  30 tablet  0  . [DISCONTINUED] lisinopril-hydrochlorothiazide (PRINZIDE,ZESTORETIC) 20-25 MG per tablet Take 1 tablet by mouth daily.       Allergies  Allergen Reactions  . Latex Itching    ROS: Pertinent items in HPI  OBJECTIVE Blood pressure 107/58, pulse 74, temperature 98 F (36.7 C), temperature source Oral, resp. rate 16, height 5\' 8"  (1.727 m), weight 322 lb (146.058 kg), last menstrual period 11/14/2013, SpO2 98.00%. GENERAL: Well-developed, well-nourished female in no acute distress.  HEENT: Normocephalic HEART: normal rate, heart sounds, regular rhythm RESP: normal effort, lung sounds clear and equal bilaterally ABDOMEN: Soft, non-tender EXTREMITIES: Nontender,  no edema NEURO: Alert and oriented SPECULUM EXAM: Deferred  LAB RESULTS Results for orders placed during the hospital encounter of 12/03/13 (from the past 24 hour(s))  URINALYSIS, ROUTINE W REFLEX MICROSCOPIC     Status: Abnormal   Collection Time    12/03/13 10:27 PM      Result Value Ref Range   Color, Urine YELLOW  YELLOW   APPearance CLEAR  CLEAR   Specific Gravity, Urine 1.025  1.005 - 1.030   pH 6.0  5.0 - 8.0   Glucose, UA NEGATIVE  NEGATIVE mg/dL   Hgb urine dipstick NEGATIVE  NEGATIVE    Bilirubin Urine NEGATIVE  NEGATIVE   Ketones, ur 15 (*) NEGATIVE mg/dL   Protein, ur NEGATIVE  NEGATIVE mg/dL   Urobilinogen, UA 0.2  0.0 - 1.0 mg/dL   Nitrite NEGATIVE  NEGATIVE   Leukocytes, UA NEGATIVE  NEGATIVE  POCT PREGNANCY, URINE     Status: None   Collection Time    12/03/13 11:33 PM      Result Value Ref Range   Preg Test, Ur NEGATIVE  NEGATIVE  CBC     Status: Abnormal   Collection Time    12/04/13 12:58 AM      Result Value Ref Range   WBC 6.0  4.0 - 10.5 K/uL   RBC 4.11  3.87 - 5.11 MIL/uL   Hemoglobin 8.1 (*) 12.0 - 15.0 g/dL   HCT 27.8 (*) 36.0 - 46.0 %   MCV 67.6 (*) 78.0 - 100.0 fL   MCH 19.7 (*) 26.0 - 34.0 pg   MCHC 29.1 (*) 30.0 - 36.0 g/dL   RDW 18.6 (*) 11.5 - 15.5 %   Platelets 437 (*) 150 - 400 K/uL  COMPREHENSIVE METABOLIC PANEL     Status: Abnormal   Collection Time    12/04/13 12:58 AM      Result Value Ref Range   Sodium 136 (*) 137 - 147 mEq/L   Potassium 3.7  3.7 - 5.3 mEq/L   Chloride 100  96 - 112 mEq/L   CO2 26  19 - 32 mEq/L   Glucose, Bld 95  70 - 99 mg/dL   BUN 10  6 - 23 mg/dL   Creatinine, Ser 0.57  0.50 - 1.10 mg/dL   Calcium 8.6  8.4 - 10.5 mg/dL   Total Protein 7.4  6.0 - 8.3 g/dL   Albumin 3.3 (*) 3.5 - 5.2 g/dL   AST 19  0 - 37 U/L   ALT 11  0 - 35 U/L   Alkaline Phosphatase 49  39 - 117 U/L   Total Bilirubin <0.2 (*) 0.3 - 1.2 mg/dL   GFR calc non Af Amer >90  >90 mL/min   GFR calc Af Amer >90  >90 mL/min   Anion gap 10  5 - 15    IMAGING US Transvaginal Non-ob  11/13/2013   CLINICAL DATA:  Leiomyoma of the uterus  EXAM: TRANSABDOMINAL AND TRANSVAGINAL ULTRASOUND OF PELVIS  TECHNIQUE: Both transabdominal and transvaginal ultrasound examinations of the pelvis were performed. Transabdominal technique was performed for global imaging of the pelvis including uterus, ovaries, adnexal regions, and pelvic cul-de-sac. It was necessary to proceed with endovaginal exam following the transabdominal exam to visualize the uterus and  ovaries.  COMPARISON:  10/25/2012  FINDINGS: Uterus  Measurements: 7.6 x 4.5 x 4.1 cm. There are 2 heterogeneous hypoechoic uterine masses. The right and fundal mass measures 4.1 x 3.8 x 4.2 cm most consistent with  a uterine fibroid. The smaller hypoechoic mass in the lower uterine segment measures 1.2 x 0.9 x 1.1 cm.  Endometrium  Thickness: 2.4 mm.  No focal abnormality visualized.  Right ovary  Measurements: 3.4 x 2.1 x 2.5 cm. Normal appearance/no adnexal mass.  Left ovary  Measurements: 4.6 x 4.3 x 3.8 cm. There is a 3.6 x 3.1 x 3.6 cm anechoic left ovarian mass with increased through transmission likely representing a cyst.  Other findings  No free fluid.  IMPRESSION: 1. There are 2 uterine fibroids with the largest right fundal fibroid measuring 4.1 x 3.8 x 4.2 cm. 2. Simple left ovarian cyst. This is almost certainly benign, and no specific imaging follow up is recommended according to the Society of Radiologists in [place] Gordy Levan et al. Management of Asymptomatic Ovarian and Other Adnexal Cysts Imaged at [place]: Society of Radiologists in [place] 2010. Radiology 256 (Sept 2010): 414-239.).   Electronically Signed   By: Kathreen Devoid   On: 11/13/2013 12:01   US Pelvis Complete  11/13/2013   CLINICAL DATA:  Leiomyoma of the uterus  EXAM: TRANSABDOMINAL AND TRANSVAGINAL ULTRASOUND OF PELVIS  TECHNIQUE: Both transabdominal and transvaginal ultrasound examinations of the pelvis were performed. Transabdominal technique was performed for global imaging of the pelvis including uterus, ovaries, adnexal regions, and pelvic cul-de-sac. It was necessary to proceed with endovaginal exam following the transabdominal exam to visualize the uterus and ovaries.  COMPARISON:  10/25/2012  FINDINGS: Uterus  Measurements: 7.6 x 4.5 x 4.1 cm. There are 2 heterogeneous hypoechoic uterine masses. The right and fundal mass measures 4.1 x 3.8 x 4.2 cm most  consistent with a uterine fibroid. The smaller hypoechoic mass in the lower uterine segment measures 1.2 x 0.9 x 1.1 cm.  Endometrium  Thickness: 2.4 mm.  No focal abnormality visualized.  Right ovary  Measurements: 3.4 x 2.1 x 2.5 cm. Normal appearance/no adnexal mass.  Left ovary  Measurements: 4.6 x 4.3 x 3.8 cm. There is a 3.6 x 3.1 x 3.6 cm anechoic left ovarian mass with increased through transmission likely representing a cyst.  Other findings  No free fluid.  IMPRESSION: 1. There are 2 uterine fibroids with the largest right fundal fibroid measuring 4.1 x 3.8 x 4.2 cm. 2. Simple left ovarian cyst. This is almost certainly benign, and no specific imaging follow up is recommended according to the Society of Radiologists in [place] Gordy Levan et al. Management of Asymptomatic Ovarian and Other Adnexal Cysts Imaged at [place]: Society of Radiologists in [place] 2010. Radiology 256 (Sept 2010): 532-023.).   Electronically Signed   By: Kathreen Devoid   On: 11/13/2013 12:01    ASSESSMENT 1. Menorrhagia with regular cycle   2. Anemia due to chronic blood loss   3. Leiomyoma of uterus, unspecified     PLAN Toradol 60 mg IM x 1 dose in MAU Discharge home Percocet 5/325, take 1-2 tabs Q 6 hours PRN x 15 tabs Megace 40 mg BID Continue Integra iron replacement F/U with Dr Roselie Awkward as scheduled this month Return to MAU as needed for emergencies    Medication List         Hydrocodone-Acetaminophen 5-300 MG Tabs  Take 1-2 tablets by mouth every 6 (six) hours as needed.     INTEGRA PLUS Caps  Take 1 capsule by mouth daily.     losartan-hydrochlorothiazide 100-25 MG per tablet  Commonly known as:  HYZAAR  Take 1 tablet by mouth daily.     megestrol 40 MG tablet  Commonly known as:  MEGACE  Take 1 tablet (40 mg total) by mouth 2 (two) times daily.     oxyCODONE-acetaminophen 5-325 MG per tablet  Commonly known as:   PERCOCET/ROXICET  Take 2 tablets by mouth stat.     pantoprazole 40 MG tablet  Commonly known as:  PROTONIX  Take 40 mg by mouth daily.     traMADol-acetaminophen 37.5-325 MG per tablet  Commonly known as:  ULTRACET  Take 1-2 tablets by mouth every 6 (six) hours as needed.       Follow-up Information   Follow up with Emeterio Reeve, MD. (As scheduled)    Specialty:  Obstetrics and Gynecology   Contact information:   [place] [address] [phone]       Follow up with [facility]. (As needed for emergencies)    Contact information:   [address] [id number] [place] [address] [phone]      Fatima Blank Certified Nurse-Midwife 12/04/2013  4:40 AM

## 2013-12-04 NOTE — Discharge Instructions (Signed)
Abnormal Uterine Bleeding Abnormal uterine bleeding can affect women at various stages in life, including teenagers, women in their reproductive years, pregnant women, and women who have reached menopause. Several kinds of uterine bleeding are considered abnormal, including:  Bleeding or spotting between periods.   Bleeding after sexual intercourse.   Bleeding that is heavier or more than normal.   Periods that last longer than usual.  Bleeding after menopause.  Many cases of abnormal uterine bleeding are minor and simple to treat, while others are more serious. Any type of abnormal bleeding should be evaluated by your health care provider. Treatment will depend on the cause of the bleeding. HOME CARE INSTRUCTIONS Monitor your condition for any changes. The following actions may help to alleviate any discomfort you are experiencing:  Avoid the use of tampons and douches as directed by your health care provider.  Change your pads frequently. You should get regular pelvic exams and Pap tests. Keep all follow-up appointments for diagnostic tests as directed by your health care provider.  SEEK MEDICAL CARE IF:   Your bleeding lasts more than 1 week.   You feel dizzy at times.  SEEK IMMEDIATE MEDICAL CARE IF:   You pass out.   You are changing pads every 15 to 30 minutes.   You have abdominal pain.  You have a fever.   You become sweaty or weak.   You are passing large blood clots from the vagina.   You start to feel nauseous and vomit. MAKE SURE YOU:   Understand these instructions.  Will watch your condition.  Will get help right away if you are not doing well or get worse. Document Released: 04/19/2005 Document Revised: 04/24/2013 Document Reviewed: 11/16/2012 [facility] Patient Information 2015 [place], [place]. This information is not intended to replace advice given to you by your health care provider. Make sure you discuss any questions you have with your  health care provider. Uterine Fibroid A uterine fibroid is a growth (tumor) that occurs in your uterus. This type of tumor is not cancerous and does not spread out of the uterus. You can have one or many fibroids. Fibroids can vary in size, weight, and where they grow in the uterus. Some can become quite large. Most fibroids do not require medical treatment, but some can cause pain or heavy bleeding during and between periods. CAUSES  A fibroid is the result of a single uterine cell that keeps growing (unregulated), which is different than most cells in the human body. Most cells have a control mechanism that keeps them from reproducing without control.  SIGNS AND SYMPTOMS   Bleeding.  Pelvic pain and pressure.  Bladder problems due to the size of the fibroid.  Infertility and miscarriages depending on the size and location of the fibroid. DIAGNOSIS  Uterine fibroids are diagnosed through a physical exam. Your health care provider may feel the lumpy tumors during a pelvic exam. Ultrasonography may be done to get information regarding size, location, and number of tumors.  TREATMENT   Your health care provider may recommend watchful waiting. This involves getting the fibroid checked by your health care provider to see if it grows or shrinks.   Hormone treatment or an intrauterine device (IUD) may be prescribed.   Surgery may be needed to remove the fibroids (myomectomy) or the uterus (hysterectomy). This depends on your situation. When fibroids interfere with fertility and a woman wants to become pregnant, a health care provider may recommend having the fibroids removed.  HOME  CARE INSTRUCTIONS  Home care depends on how you were treated. In general:   Keep all follow-up appointments with your health care provider.   Only take over-the-counter or prescription medicines as directed by your health care provider. If you were prescribed a hormone treatment, take the hormone medicines  exactly as directed. Do not take aspirin. It can cause bleeding.   Talk to your health care provider about taking iron pills.  If your periods are troublesome but not so heavy, lie down with your feet raised slightly above your heart. Place cold packs on your lower abdomen.   If your periods are heavy, write down the number of pads or tampons you use per month. Bring this information to your health care provider.   Include green vegetables in your diet.  SEEK IMMEDIATE MEDICAL CARE IF:  You have pelvic pain or cramps not controlled with medicines.   You have a sudden increase in pelvic pain.   You have an increase in bleeding between and during periods.   You have excessive periods and soak tampons or pads in a half hour or less.  You feel lightheaded or have fainting episodes. Document Released: 04/16/2000 Document Revised: 02/07/2013 Document Reviewed: 11/16/2012 [facility] Patient Information 2015 [place], [place]. This information is not intended to replace advice given to you by your health care provider. Make sure you discuss any questions you have with your health care provider. Iron Deficiency Anemia Anemia is a condition in which there are less red blood cells or hemoglobin in the blood than normal. Hemoglobin is the part of red blood cells that carries oxygen. Iron deficiency anemia is anemia caused by too little iron. It is the most common type of anemia. It may leave you tired and short of breath. CAUSES   Lack of iron in the diet.  Poor absorption of iron, as seen with intestinal disorders.  Intestinal bleeding.  Heavy periods. SIGNS AND SYMPTOMS  Mild anemia may not be noticeable. Symptoms may include:  Fatigue.  Headache.  Pale skin.  Weakness.  Tiredness.  Shortness of breath.  Dizziness.  Cold hands and feet.  Fast or irregular heartbeat. DIAGNOSIS  Diagnosis requires a thorough evaluation and physical exam by your health care provider. Blood  tests are generally used to confirm iron deficiency anemia. Additional tests may be done to find the underlying cause of your anemia. These may include:  Testing for blood in the stool (fecal occult blood test).  A procedure to see inside the colon and rectum (colonoscopy).  A procedure to see inside the esophagus and stomach (endoscopy). TREATMENT  Iron deficiency anemia is treated by correcting the cause of the deficiency. Treatment may involve:  Adding iron-rich foods to your diet.  Taking iron supplements. Pregnant or breastfeeding women need to take extra iron because their normal diet usually does not provide the required amount.  Taking vitamins. Vitamin C improves the absorption of iron. Your health care provider may recommend that you take your iron tablets with a glass of orange juice or vitamin C supplement.  Medicines to make heavy menstrual flow lighter.  Surgery. HOME CARE INSTRUCTIONS   Take iron as directed by your health care provider.  If you cannot tolerate taking iron supplements by mouth, talk to your health care provider about taking them through a vein (intravenously) or an injection into a muscle.  For the best iron absorption, iron supplements should be taken on an empty stomach. If you cannot tolerate them on an empty  stomach, you may need to take them with food.  Do not drink milk or take antacids at the same time as your iron supplements. Milk and antacids may interfere with the absorption of iron.  Iron supplements can cause constipation. Make sure to include fiber in your diet to prevent constipation. A stool softener may also be recommended.  Take vitamins as directed by your health care provider.  Eat a diet rich in iron. Foods high in iron include liver, lean beef, whole-grain bread, eggs, dried fruit, and dark green leafy vegetables. SEEK IMMEDIATE MEDICAL CARE IF:   You faint. If this happens, do not drive. Call your local emergency services (911  in [place] if no other help is available.  You have chest pain.  You feel nauseous or vomit.  You have severe or increased shortness of breath with activity.  You feel weak.  You have a rapid heartbeat.  You have unexplained sweating.  You become light-headed when getting up from a chair or bed. MAKE SURE YOU:   Understand these instructions.  Will watch your condition.  Will get help right away if you are not doing well or get worse. Document Released: 04/16/2000 Document Revised: 04/24/2013 Document Reviewed: 12/25/2012 [facility] Patient Information 2015 [place], [place]. This information is not intended to replace advice given to you by your health care provider. Make sure you discuss any questions you have with your health care provider.

## 2013-12-05 NOTE — MAU Provider Note (Signed)
Attestation of Attending Supervision of Advanced Practitioner (CNM/NP): Evaluation and management procedures were performed by the Advanced Practitioner under my supervision and collaboration.  I have reviewed the Advanced Practitioner's note and chart, and I agree with the management and plan.  HARRAWAY-SMITH, Alexsia Klindt 1:31 PM

## 2013-12-13 ENCOUNTER — Encounter: Payer: Self-pay | Admitting: Obstetrics & Gynecology

## 2013-12-13 ENCOUNTER — Ambulatory Visit (INDEPENDENT_AMBULATORY_CARE_PROVIDER_SITE_OTHER): Payer: Medicaid Other | Admitting: Obstetrics & Gynecology

## 2013-12-13 VITALS — BP 123/71 | HR 85 | Ht 68.0 in | Wt 317.0 lb

## 2013-12-13 DIAGNOSIS — N920 Excessive and frequent menstruation with regular cycle: Secondary | ICD-10-CM

## 2013-12-13 DIAGNOSIS — N921 Excessive and frequent menstruation with irregular cycle: Secondary | ICD-10-CM

## 2013-12-13 MED ORDER — OXYCODONE-ACETAMINOPHEN 5-325 MG PO TABS: 1.0000 | ORAL_TABLET | Freq: Four times a day (QID) | ORAL | Status: AC | PRN

## 2013-12-13 MED ORDER — LEUPROLIDE ACETATE (3 MONTH) 11.25 MG IM KIT
11.2500 mg | PACK | Freq: Once | INTRAMUSCULAR | Status: AC
  Administered 2013-12-13: 11.25 mg via INTRAMUSCULAR

## 2013-12-13 NOTE — Patient Instructions (Signed)
Hysterectomy Information  A hysterectomy is a surgery in which your uterus is removed. This surgery may be done to treat various medical problems. After the surgery, you will no longer have menstrual periods. The surgery will also make you unable to become pregnant (sterile). The fallopian tubes and ovaries can be removed (bilateral salpingo-oophorectomy) during this surgery as well.  REASONS FOR A HYSTERECTOMY  Persistent, abnormal bleeding.  Lasting (chronic) pelvic pain or infection.  The lining of the uterus (endometrium) starts growing outside the uterus (endometriosis).  The endometrium starts growing in the muscle of the uterus (adenomyosis).  The uterus falls down into the vagina (pelvic organ prolapse).  Noncancerous growths in the uterus (uterine fibroids) that cause symptoms.  Precancerous cells.  Cervical cancer or uterine cancer. TYPES OF HYSTERECTOMIES  Supracervical hysterectomy--In this type, the top part of the uterus is removed, but not the cervix.  Total hysterectomy--The uterus and cervix are removed.  Radical hysterectomy--The uterus, the cervix, and the fibrous tissue that holds the uterus in place in the pelvis (parametrium) are removed. WAYS A HYSTERECTOMY CAN BE PERFORMED  Abdominal hysterectomy--A large surgical cut (incision) is made in the abdomen. The uterus is removed through this incision.  Vaginal hysterectomy--An incision is made in the vagina. The uterus is removed through this incision. There are no abdominal incisions.  Conventional laparoscopic hysterectomy--Three or four small incisions are made in the abdomen. A thin, lighted tube with a camera (laparoscope) is inserted into one of the incisions. Other tools are put through the other incisions. The uterus is cut into small pieces. The small pieces are removed through the incisions, or they are removed through the vagina.  Laparoscopically assisted vaginal hysterectomy (LAVH)--Three or four  small incisions are made in the abdomen. Part of the surgery is performed laparoscopically and part vaginally. The uterus is removed through the vagina.  Robot-assisted laparoscopic hysterectomy--A laparoscope and other tools are inserted into 3 or 4 small incisions in the abdomen. A computer-controlled device is used to give the surgeon a 3D image and to help control the surgical instruments. This allows for more precise movements of surgical instruments. The uterus is cut into small pieces and removed through the incisions or removed through the vagina. RISKS AND COMPLICATIONS  Possible complications associated with this procedure include:  Bleeding and risk of blood transfusion. Tell your health care provider if you do not want to receive any blood products.  Blood clots in the legs or lung.  Infection.  Injury to surrounding organs.  Problems or side effects related to anesthesia.  Conversion to an abdominal hysterectomy from one of the other techniques. WHAT TO EXPECT AFTER A HYSTERECTOMY  You will be given pain medicine.  You will need to have someone with you for the first 3-5 days after you go home.  You will need to follow up with your surgeon in 2-4 weeks after surgery to evaluate your progress.  You may have early menopause symptoms such as hot flashes, night sweats, and insomnia.  If you had a hysterectomy for a problem that was not cancer or not a condition that could lead to cancer, then you no longer need Pap tests. However, even if you no longer need a Pap test, a regular exam is a good idea to make sure no other problems are starting. Document Released: 10/13/2000 Document Revised: 02/07/2013 Document Reviewed: 12/25/2012 [facility] Patient Information 2015 [place], [place]. This information is not intended to replace advice given to you by your health care  provider. Make sure you discuss any questions you have with your health care provider.

## 2013-12-13 NOTE — Progress Notes (Signed)
Patient ID: Gabriela Perez, female   DOB: 1981/02/16, 33 y.o.   MRN: [id number]  Chief Complaint  Patient presents with  . Results    HPI Gabriela Perez is a 33 y.o. female.  G1P1001 Patient's last menstrual period was 12/13/2013. Still has DUB and dysmenorrhea after ablation 06/2013, wants definitive therapy. On Megace but still bleeding  HPI  Past Medical History  Diagnosis Date  . Anemia   . Hypertension   . GERD (gastroesophageal reflux disease)   . History of blood transfusion 08/2012    [facility]  2 units transfused    Past Surgical History  Procedure Laterality Date  . Cesarean section  2003  . Novasure ablation N/A 06/19/2013    Procedure: NOVASURE ABLATION;  Surgeon: Woodroe Mode, MD;  Location: [facility];  Service: Gynecology;  Laterality: N/A;  . Laparoscopic tubal ligation Bilateral 06/19/2013    Procedure: LAPAROSCOPIC TUBAL LIGATION;  Surgeon: Woodroe Mode, MD;  Location: [facility];  Service: Gynecology;  Laterality: Bilateral;    Family History  Problem Relation Age of Onset  . Hypertension Mother   . Diabetes Mother   . Hypertension Father     Social History History  Substance Use Topics  . Smoking status: Never Smoker   . Smokeless tobacco: Never Used  . Alcohol Use: Yes     Comment: occasionally    Allergies  Allergen Reactions  . Latex Itching    Current Outpatient Prescriptions  Medication Sig Dispense Refill  . FeFum-FePoly-FA-B Cmp-C-Biot (INTEGRA PLUS) CAPS Take 1 capsule by mouth daily.  30 capsule  3  . losartan-hydrochlorothiazide (HYZAAR) 100-25 MG per tablet Take 1 tablet by mouth daily.  30 tablet  0  . pantoprazole (PROTONIX) 40 MG tablet Take 40 mg by mouth daily.      . Hydrocodone-Acetaminophen 5-300 MG TABS Take 1-2 tablets by mouth every 6 (six) hours as needed.  15 each  0  . megestrol (MEGACE) 40 MG tablet Take 1 tablet (40 mg total) by mouth 2 (two) times daily.  60 tablet  0  . oxyCODONE-acetaminophen (PERCOCET/ROXICET) 5-325 MG  per tablet Take 1 tablet by mouth every 6 (six) hours as needed for severe pain.  20 tablet  0  . traMADol-acetaminophen (ULTRACET) 37.5-325 MG per tablet Take 1-2 tablets by mouth every 6 (six) hours as needed.  30 tablet  0  . [DISCONTINUED] lisinopril-hydrochlorothiazide (PRINZIDE,ZESTORETIC) 20-25 MG per tablet Take 1 tablet by mouth daily.       No current facility-administered medications for this visit.    Review of Systems Review of Systems  Constitutional: Negative.   Respiratory: Negative.   Genitourinary: Positive for vaginal bleeding and pelvic pain. Negative for vaginal discharge.    Blood pressure 123/71, pulse 85, height 5\' 8"  (1.727 m), weight 317 lb (143.79 kg), last menstrual period 12/13/2013.  Physical Exam Physical Exam  Constitutional: She is oriented to person, place, and time. She appears well-developed. No distress.  Neurological: She is alert and oriented to person, place, and time.  Skin: Skin is warm and dry. No pallor.  Psychiatric: She has a normal mood and affect. Her behavior is normal.    Data Reviewed CBC    Component Value Date/Time   WBC 6.0 12/04/2013 0058   RBC 4.11 12/04/2013 0058   RBC 3.69* 08/15/2012 0115   HGB 8.1* 12/04/2013 0058   HCT 27.8* 12/04/2013 0058   PLT 437* 12/04/2013 0058   MCV 67.6* 12/04/2013 0058   MCH 19.7* 12/04/2013  0058   MCHC 29.1* 12/04/2013 0058   RDW 18.6* 12/04/2013 0058   LYMPHSABS 1.7 08/22/2012 1825   MONOABS 0.5 08/22/2012 1825   EOSABS 0.1 08/22/2012 1825   BASOSABS 0.1 08/22/2012 1825      Assessment    DUB unresponsive to medical and surgical management     Plan    Offered LAVH h/o C/S, procedure and risks explained. Lupron depot 11.25 mg IM today, continue megace and iron, RTC 3 weeks, schedule surgery early Oct.        Gabriela Perez 12/13/2013, 5:02 PM

## 2014-01-03 ENCOUNTER — Other Ambulatory Visit: Payer: Self-pay | Admitting: Obstetrics & Gynecology

## 2014-03-01 ENCOUNTER — Ambulatory Visit: Payer: Medicaid Other

## 2014-03-04 ENCOUNTER — Encounter: Payer: Self-pay | Admitting: Obstetrics & Gynecology

## 2014-03-06 ENCOUNTER — Ambulatory Visit (INDEPENDENT_AMBULATORY_CARE_PROVIDER_SITE_OTHER): Payer: Medicaid Other | Admitting: *Deleted

## 2014-03-06 VITALS — BP 122/72 | HR 86 | Wt 299.8 lb

## 2014-03-06 DIAGNOSIS — N938 Other specified abnormal uterine and vaginal bleeding: Secondary | ICD-10-CM

## 2014-03-06 DIAGNOSIS — R58 Hemorrhage, not elsewhere classified: Secondary | ICD-10-CM

## 2014-03-06 MED ORDER — LEUPROLIDE ACETATE (3 MONTH) 11.25 MG IM KIT
11.2500 mg | PACK | Freq: Once | INTRAMUSCULAR | Status: AC
  Administered 2014-03-06: 11.25 mg via INTRAMUSCULAR

## 2014-03-06 NOTE — Progress Notes (Signed)
Depo Lupron 11.25 mg IM given .

## 2014-04-02 ENCOUNTER — Encounter (HOSPITAL_COMMUNITY): Admission: RE | Payer: Self-pay | Source: Ambulatory Visit

## 2014-04-02 ENCOUNTER — Ambulatory Visit (HOSPITAL_COMMUNITY)
Admission: RE | Admit: 2014-04-02 | Payer: Medicaid Other | Source: Ambulatory Visit | Admitting: Obstetrics & Gynecology

## 2014-04-02 SURGERY — HYSTERECTOMY, VAGINAL, LAPAROSCOPY-ASSISTED
Anesthesia: Choice | Site: Vagina

## 2014-04-30 ENCOUNTER — Telehealth: Payer: Self-pay | Admitting: *Deleted

## 2014-04-30 NOTE — Telephone Encounter (Addendum)
Pt left message stating that she wants a referral to change her primary care to a physician office in [place], [place]. I returned pt's call and discussed her request. I stated first that we are a specialty office, not primary care. We cannot provide a referral because our physicians do not know physicians in the [place] area. I recommended that she schedule appt with an area physician and then go to the office a week or so prior to the appt and sign a release of information form. We will then send her medical records to the office so that the new doctor will have them at the time of her visit. I also confirmed with pt that she is not going to have the surgery which is scheduled for 05/07/14 w/Dr. Roselie Awkward. She said that she has already called and informed [place] to cancel her surgery. She then asked to schedule a follow up appt w/Dr. Roselie Awkward for later in the year. I stated that we do not have the ability to schedule past February at this time. I advised that she call back within a month or 2 of the time she wants appt and should be able to schedule then.  Pt voiced understanding of all information and advice given.

## 2014-05-06 ENCOUNTER — Encounter (HOSPITAL_COMMUNITY): Payer: Self-pay | Admitting: Anesthesiology

## 2014-05-06 NOTE — Anesthesia Preprocedure Evaluation (Deleted)
Anesthesia Evaluation  Patient identified by MRN, date of birth, ID band Patient awake    Reviewed: Allergy & Precautions, NPO status , Patient's Chart, lab work & pertinent test results  Airway        Dental   Pulmonary neg pulmonary ROS,          Cardiovascular hypertension,     Neuro/Psych negative neurological ROS  negative psych ROS   GI/Hepatic Neg liver ROS, GERD-  Medicated and Controlled,  Endo/Other  Morbid obesity  Renal/GU negative Renal ROS     Musculoskeletal negative musculoskeletal ROS (+)   Abdominal   Peds  Hematology  (+) anemia ,   Anesthesia Other Findings   Reproductive/Obstetrics DUB                             Anesthesia Physical Anesthesia Plan  ASA: III  Anesthesia Plan: General   Post-op Pain Management:    Induction: Intravenous  Airway Management Planned: Oral ETT  Additional Equipment:   Intra-op Plan:   Post-operative Plan: Extubation in OR  Informed Consent: I have reviewed the patients History and Physical, chart, labs and discussed the procedure including the risks, benefits and alternatives for the proposed anesthesia with the patient or authorized representative who has indicated his/her understanding and acceptance.   Dental advisory given  Plan Discussed with: CRNA, Anesthesiologist and Surgeon  Anesthesia Plan Comments:         Anesthesia Quick Evaluation

## 2014-05-07 ENCOUNTER — Ambulatory Visit (HOSPITAL_COMMUNITY)
Admission: RE | Admit: 2014-05-07 | Payer: Medicaid Other | Source: Ambulatory Visit | Admitting: Obstetrics & Gynecology

## 2014-05-07 ENCOUNTER — Encounter (HOSPITAL_COMMUNITY): Admission: RE | Payer: Self-pay | Source: Ambulatory Visit

## 2014-05-07 SURGERY — HYSTERECTOMY, VAGINAL, LAPAROSCOPY-ASSISTED
Anesthesia: Choice | Site: Abdomen

## 2014-05-22 ENCOUNTER — Ambulatory Visit: Payer: Medicaid Other

## 2014-05-29 ENCOUNTER — Telehealth: Payer: Self-pay | Admitting: *Deleted

## 2014-05-29 NOTE — Telephone Encounter (Signed)
Leoda called and left message she has questions about her  BTL done 06/19/13. Called Shanea and she states she wants to get pregnant and wants to know if the BTL can be reversed. I explained to her she could undergo surgery to get that reversed but that we do not do that surgery here, and it is usually not covered by insurance so she would need to pay up front, and it does not have a high success rate. I also informed that since she also had an novasure ablation that same day that I would need to talk to a provider and call her back tomorrow as I do not have a provider available right now. I explained I am not sure how that affects the ability to get pregnant.

## 2014-05-30 NOTE — Telephone Encounter (Signed)
Discussed with Dr. Gala Romney- called [facility] and left message I have more information for you- please call back to clinic and ask for [facility] .   Per Dr. Gala Romney patient should not try to get pregnant as the ablation puts her at high risk to develop complications such as placenta accreta.

## 2014-05-30 NOTE — Telephone Encounter (Signed)
Gabriela Perez called back and call transferred to me. Explained to Gabriela Perez per Dr. Gala Romney that it is not advised that she attempt a reversal and attempt to get pregnant. We discussed this would put her at high risk for problems and could be dangerous to her and baby because of the ablation and how that affects the uterus/ lining.  Support given . Gabriela Perez voices understanding.

## 2015-10-23 IMAGING — US US TRANSVAGINAL NON-OB
2 series · 13 of 25 positions shown · non-contrast
Comparison: 10/25/2012

CLINICAL DATA: Leiomyoma of the uterus

EXAM:
TRANSABDOMINAL AND TRANSVAGINAL ULTRASOUND OF PELVIS
TECHNIQUE: Both transabdominal and transvaginal ultrasound examinations of the
pelvis were performed. Transabdominal technique was performed for
global imaging of the pelvis including uterus, ovaries, adnexal
regions, and pelvic cul-de-sac. It was necessary to proceed with
endovaginal exam following the transabdominal exam to visualize the
uterus and ovaries.

[Series 1: us transvaginal non-ob · 9 of 56 slices shown (1 of 2)]
[im 1/56]
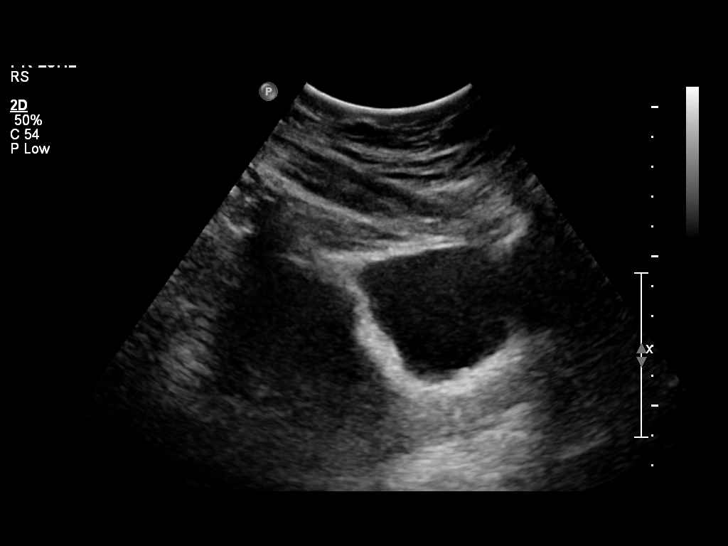
[im 7/56]
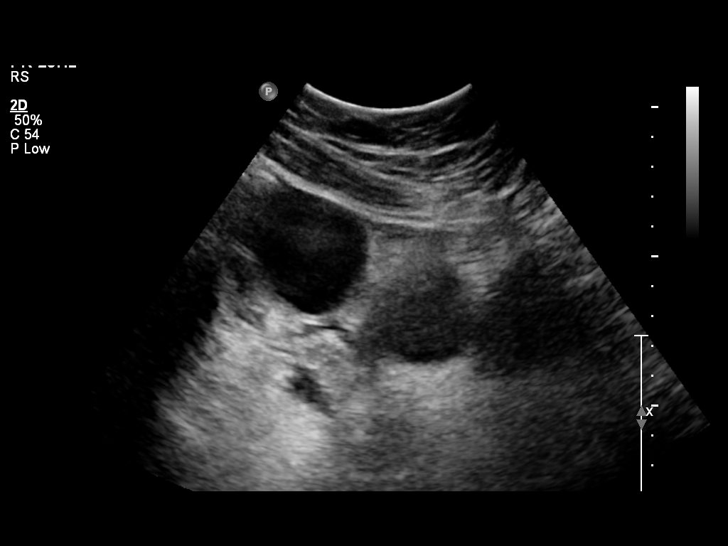
[im 13/56]
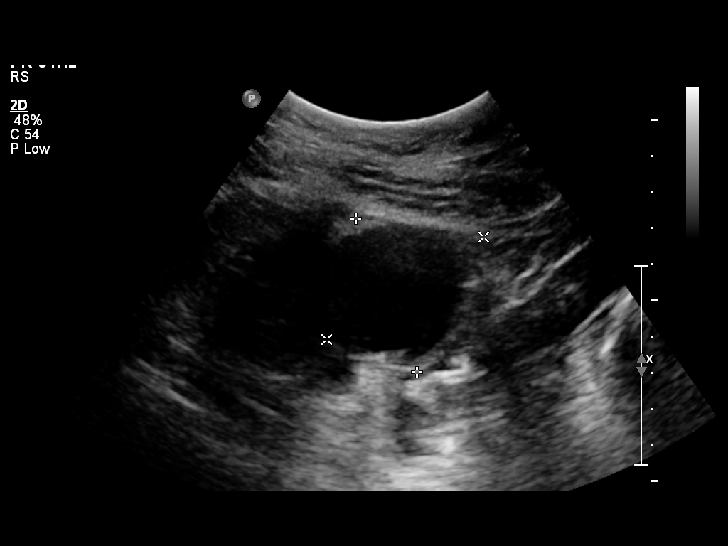
[im 20/56]
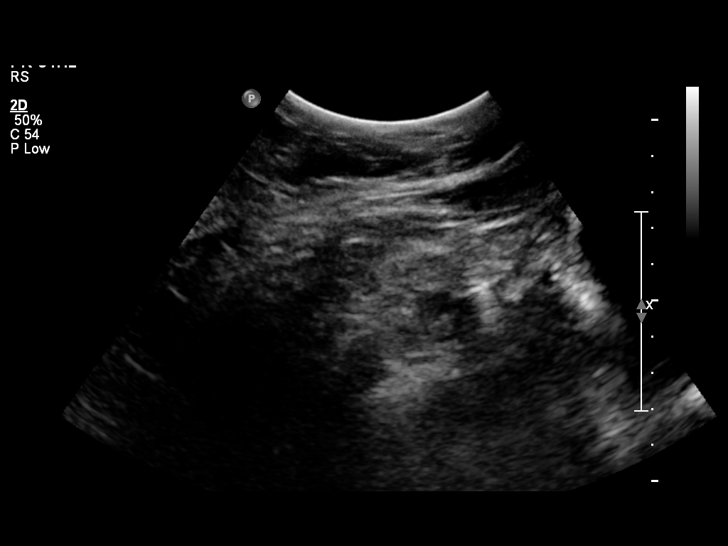
[im 26/56]
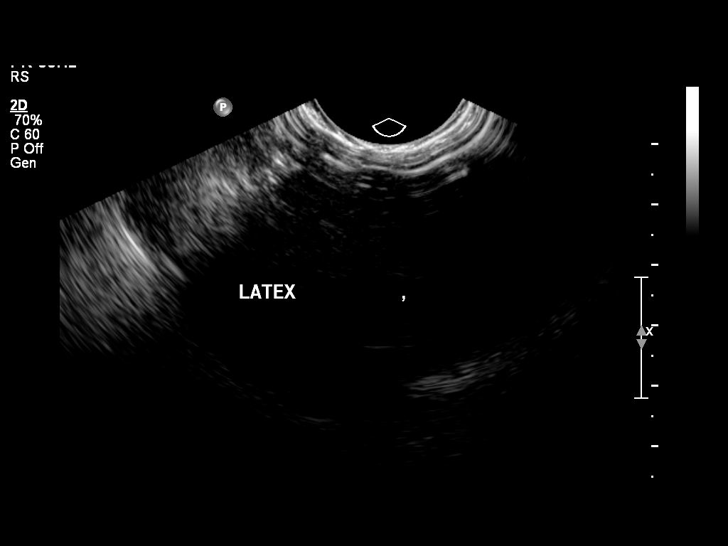
[im 33/56]
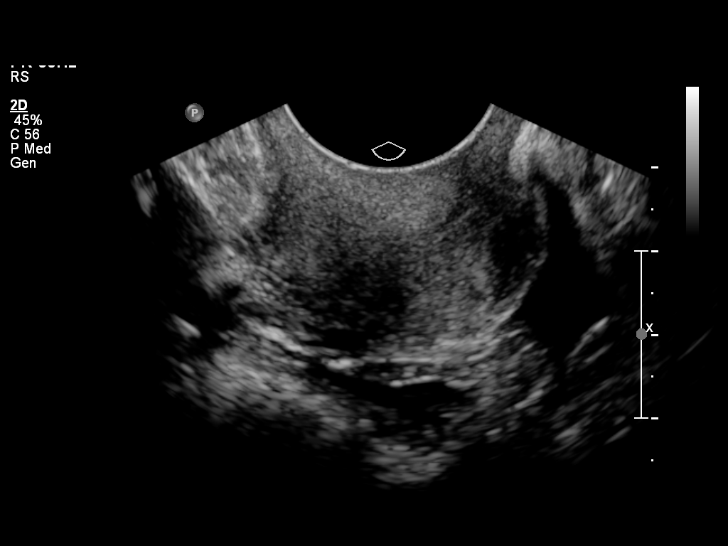
[im 39/56]
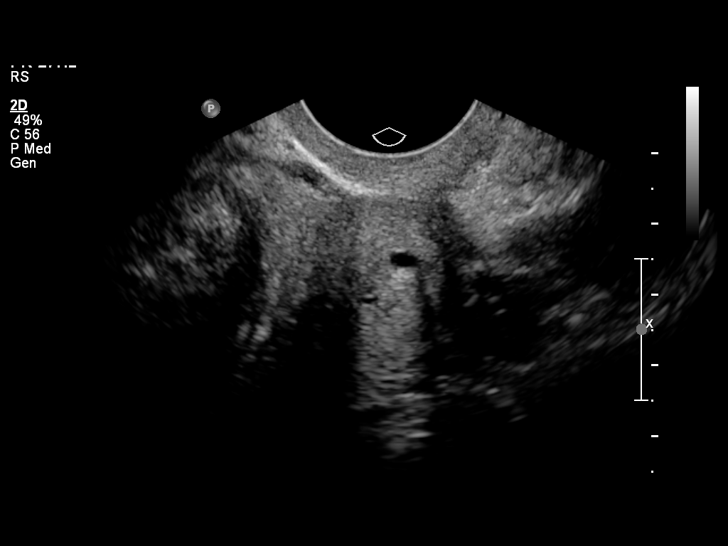
[im 46/56]
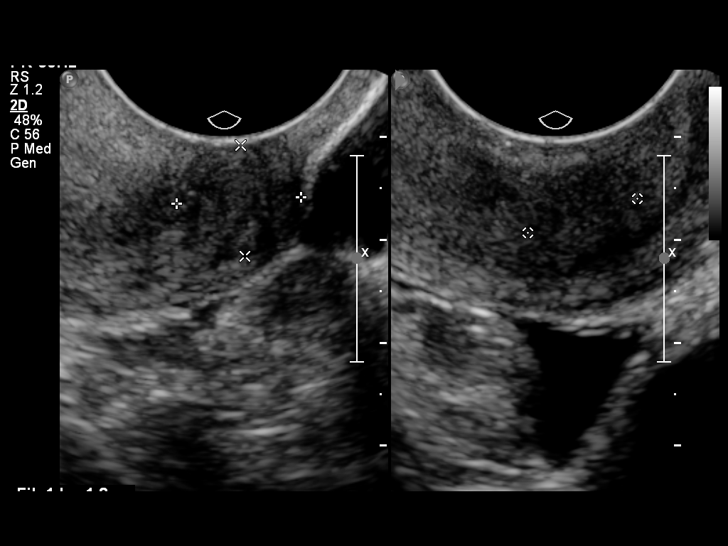
[im 52/56]
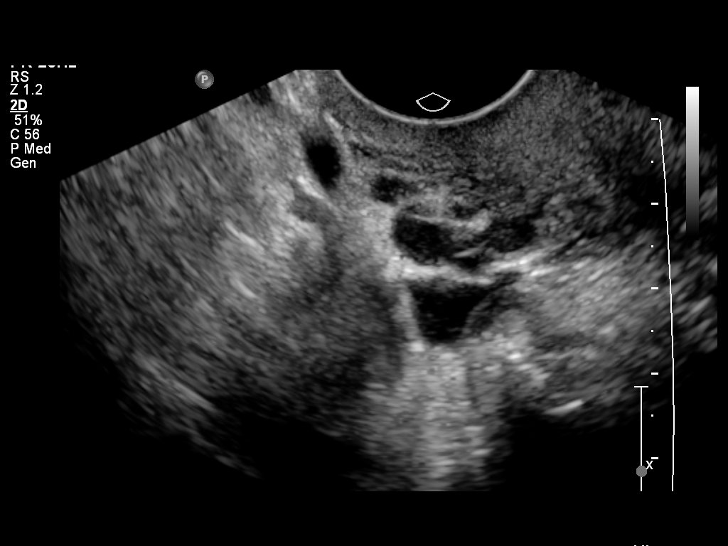

[Series 1: us transvaginal non-ob · 4 of 20 slices shown (2 of 2)]
[im 1/20]
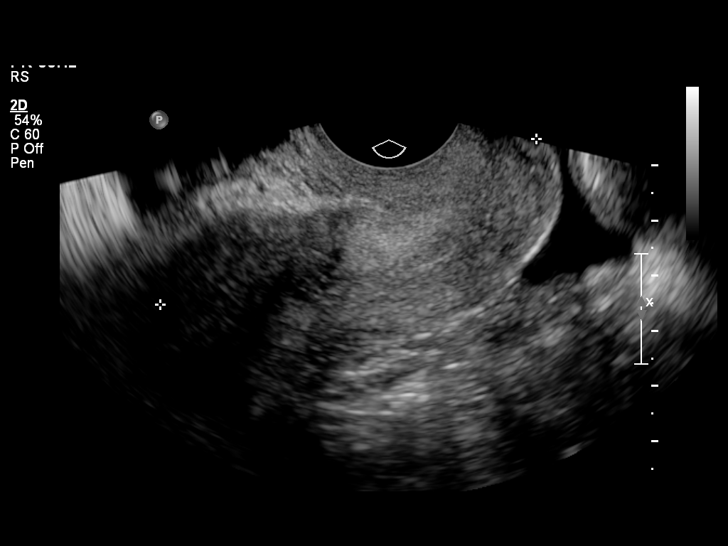
[im 7/20]
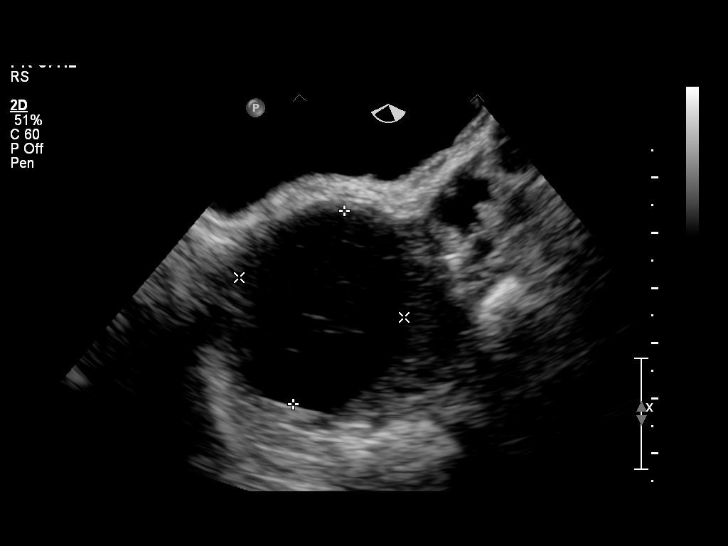
[im 13/20]
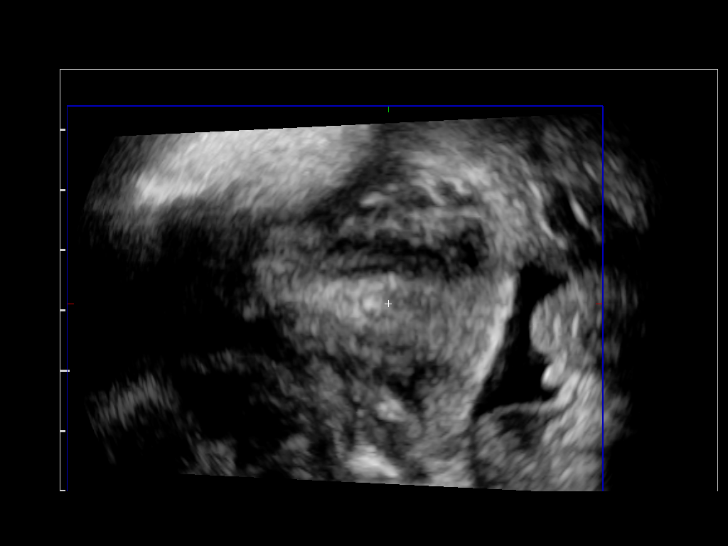
[im 20/20]
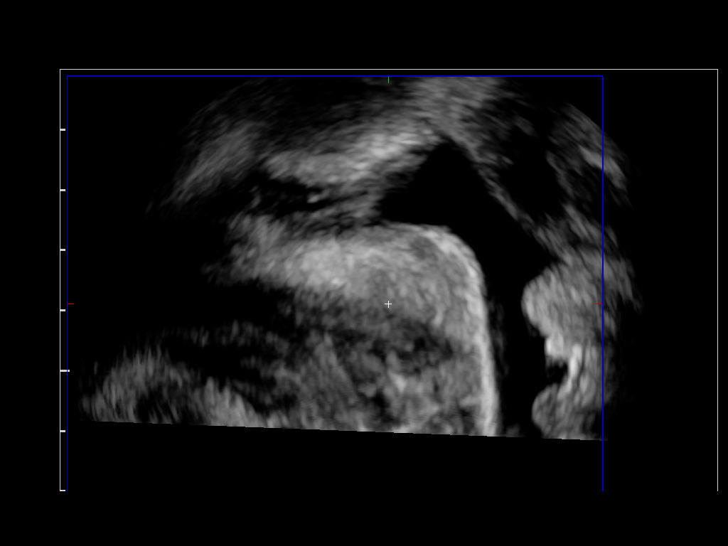

[13 of 25 positions shown; findings below may reference images not displayed]

FINDINGS: Uterus

Measurements: 7.6 x 4.5 x 4.1 cm. There are 2 heterogeneous
hypoechoic uterine masses. The right and fundal mass measures 4.1 x
3.8 x 4.2 cm most consistent with a uterine fibroid. The smaller
hypoechoic mass in the lower uterine segment measures 1.2 x 0.9 x
1.1 cm.

Endometrium

Thickness: 2.4 mm.  No focal abnormality visualized.

Right ovary

Measurements: 3.4 x 2.1 x 2.5 cm. Normal appearance/no adnexal mass.

Left ovary

Measurements: 4.6 x 4.3 x 3.8 cm. There is a 3.6 x 3.1 x 3.6 cm
anechoic left ovarian mass with increased through transmission
likely representing a cyst.

Other findings

No free fluid.
IMPRESSION: 1. There are 2 uterine fibroids with the largest right fundal
fibroid measuring 4.1 x 3.8 x 4.2 cm.
2. Simple left ovarian cyst. This is almost certainly benign, and no
specific imaging follow up is recommended according to the Society
of Radiologists in Eltrasound3R1R Consensus Conference Statement (HERSI ABDILLAHI
Normita et al. Management of Asymptomatic Ovarian and Other Adnexal
Cysts Imaged at US: Society of Radiologists in Ultrasound Consensus

## 2022-01-29 ENCOUNTER — Telehealth: Payer: Self-pay

## 2022-01-29 NOTE — Telephone Encounter (Signed)
Call to pt to offer daytime class for PREP however needs evening due to work scheduled.  Will call her back when I am scheduling for the evening class.
